# Patient Record
Sex: Female | Born: 1975 | Race: White | Hispanic: No | Marital: Married | Smoking: Never smoker
Health system: Southern US, Community
[De-identification: ages and names within clinical notes are randomized; demographics above are authoritative.]

## PROBLEM LIST (undated history)

## (undated) DIAGNOSIS — K219 Gastro-esophageal reflux disease without esophagitis: Secondary | ICD-10-CM

## (undated) HISTORY — DX: Gastro-esophageal reflux disease without esophagitis: K21.9

---

## 2002-11-06 ENCOUNTER — Other Ambulatory Visit: Admission: RE | Admit: 2002-11-06 | Discharge: 2002-11-06 | Payer: Self-pay | Admitting: *Deleted

## 2003-11-19 ENCOUNTER — Other Ambulatory Visit: Admission: RE | Admit: 2003-11-19 | Discharge: 2003-11-19 | Payer: Self-pay | Admitting: Family Medicine

## 2004-12-09 ENCOUNTER — Other Ambulatory Visit: Admission: RE | Admit: 2004-12-09 | Discharge: 2004-12-09 | Payer: Self-pay | Admitting: Family Medicine

## 2005-10-24 HISTORY — PX: LASIK: SHX215

## 2006-02-20 ENCOUNTER — Other Ambulatory Visit: Admission: RE | Admit: 2006-02-20 | Discharge: 2006-02-20 | Payer: Self-pay | Admitting: Family Medicine

## 2007-02-26 ENCOUNTER — Other Ambulatory Visit: Admission: RE | Admit: 2007-02-26 | Discharge: 2007-02-26 | Payer: Self-pay | Admitting: Family Medicine

## 2008-02-27 ENCOUNTER — Other Ambulatory Visit: Admission: RE | Admit: 2008-02-27 | Discharge: 2008-02-27 | Payer: Self-pay | Admitting: Family Medicine

## 2009-07-06 ENCOUNTER — Other Ambulatory Visit: Admission: RE | Admit: 2009-07-06 | Discharge: 2009-07-06 | Payer: Self-pay | Admitting: Family Medicine

## 2009-12-11 ENCOUNTER — Emergency Department (HOSPITAL_COMMUNITY): Admission: EM | Admit: 2009-12-11 | Discharge: 2009-12-11 | Payer: Self-pay | Admitting: Emergency Medicine

## 2010-01-12 ENCOUNTER — Ambulatory Visit: Payer: Self-pay | Admitting: Oncology

## 2010-01-20 LAB — CBC WITH DIFFERENTIAL/PLATELET
BASO%: 0.6 % (ref 0.0–2.0)
EOS%: 24.8 % — ABNORMAL HIGH (ref 0.0–7.0)
MCH: 31.5 pg (ref 25.1–34.0)
MCHC: 34.1 g/dL (ref 31.5–36.0)
MCV: 92.4 fL (ref 79.5–101.0)
MONO%: 3.2 % (ref 0.0–14.0)
RBC: 3.8 10*6/uL (ref 3.70–5.45)
RDW: 14.2 % (ref 11.2–14.5)
lymph#: 2 10*3/uL (ref 0.9–3.3)

## 2010-01-20 LAB — COMPREHENSIVE METABOLIC PANEL
ALT: 25 U/L (ref 0–35)
AST: 17 U/L (ref 0–37)
Albumin: 4 g/dL (ref 3.5–5.2)
Alkaline Phosphatase: 44 U/L (ref 39–117)
BUN: 11 mg/dL (ref 6–23)
Potassium: 4.3 mEq/L (ref 3.5–5.3)
Sodium: 140 mEq/L (ref 135–145)
Total Protein: 6.7 g/dL (ref 6.0–8.3)

## 2010-01-20 LAB — CHCC SMEAR

## 2010-03-08 ENCOUNTER — Ambulatory Visit: Payer: Self-pay | Admitting: Oncology

## 2010-03-10 LAB — CHCC SMEAR

## 2010-03-10 LAB — CBC WITH DIFFERENTIAL/PLATELET
BASO%: 0.4 % (ref 0.0–2.0)
LYMPH%: 39.9 % (ref 14.0–49.7)
MCHC: 34 g/dL (ref 31.5–36.0)
MCV: 92 fL (ref 79.5–101.0)
MONO#: 0.3 10*3/uL (ref 0.1–0.9)
MONO%: 3.9 % (ref 0.0–14.0)
NEUT#: 3.5 10*3/uL (ref 1.5–6.5)
Platelets: 294 10*3/uL (ref 145–400)
RBC: 3.66 10*6/uL — ABNORMAL LOW (ref 3.70–5.45)
RDW: 13.5 % (ref 11.2–14.5)
WBC: 6.7 10*3/uL (ref 3.9–10.3)

## 2010-07-05 ENCOUNTER — Encounter: Admission: RE | Admit: 2010-07-05 | Discharge: 2010-07-05 | Payer: Self-pay | Admitting: Internal Medicine

## 2010-07-19 ENCOUNTER — Other Ambulatory Visit: Admission: RE | Admit: 2010-07-19 | Discharge: 2010-07-19 | Payer: Self-pay | Admitting: *Deleted

## 2011-05-18 ENCOUNTER — Ambulatory Visit
Admission: RE | Admit: 2011-05-18 | Discharge: 2011-05-18 | Disposition: A | Discharge status: Home / Self Care | Payer: Commercial Managed Care - PPO | Source: Ambulatory Visit | Attending: Family Medicine | Admitting: Family Medicine

## 2011-05-18 ENCOUNTER — Other Ambulatory Visit: Payer: Self-pay | Admitting: Family Medicine

## 2011-05-18 DIAGNOSIS — R05 Cough: Secondary | ICD-10-CM

## 2011-05-18 DIAGNOSIS — R059 Cough, unspecified: Secondary | ICD-10-CM

## 2011-08-02 ENCOUNTER — Encounter: Payer: Self-pay | Admitting: Pulmonary Disease

## 2011-08-02 ENCOUNTER — Ambulatory Visit (INDEPENDENT_AMBULATORY_CARE_PROVIDER_SITE_OTHER): Payer: 59 | Admitting: Pulmonary Disease

## 2011-08-02 VITALS — BP 110/70 | HR 77 | Temp 98.0°F | Ht 67.0 in | Wt 159.8 lb

## 2011-08-02 DIAGNOSIS — R05 Cough: Secondary | ICD-10-CM

## 2011-08-02 DIAGNOSIS — R059 Cough, unspecified: Secondary | ICD-10-CM

## 2011-08-02 DIAGNOSIS — R0609 Other forms of dyspnea: Secondary | ICD-10-CM

## 2011-08-02 DIAGNOSIS — R0989 Other specified symptoms and signs involving the circulatory and respiratory systems: Secondary | ICD-10-CM

## 2011-08-02 DIAGNOSIS — R0600 Dyspnea, unspecified: Secondary | ICD-10-CM | POA: Insufficient documentation

## 2011-08-02 MED ORDER — ESOMEPRAZOLE MAGNESIUM 40 MG PO CPDR: 40.0000 mg | DELAYED_RELEASE_CAPSULE | Freq: Two times a day (BID) | ORAL | Status: DC

## 2011-08-02 NOTE — Assessment & Plan Note (Signed)
The patient has a perception of dyspnea that is manifested as an insufficient depth of inspiration and feeling of air hunger at rest and with exertion.  She denies any history that is suggestive of neuromuscular weakness.  She has a normal chest x-ray, normal air flow on exam, and normal spirometry.  I suspect this is not asthma, but if she continues to have symptoms, would recommend a methacholine challenge test to put the issue to rest.  In the interim, I would like to treat her emperically for possible reflux disease.  If she continues to have symptoms after treatment for 3 weeks, would consider proceeding with the methacholine challenge.

## 2011-08-02 NOTE — Patient Instructions (Signed)
Will try nexium 40mg  one in am and pm for next 3 weeks to see if helps. If this does not help, would consider methacholine challenge testing to put the issue of asthma to rest.

## 2011-08-02 NOTE — Progress Notes (Signed)
  Subjective:    Patient ID: Debra Downs, female    DOB: 30-Dec-1975, 35 y.o.   MRN: [id number]  HPI The patient is a 35 year old female who I've been asked to see for dyspnea.  She was in her usual state of health until April of this year, when she began to feel she could not get enough air.  She would force herself to take deep breaths frequently and also sigh breaths.  She did not feel as much restriction as the inability to take a deep breath.  She denied any wheezing.  The patient states last few weeks that it has worsened, and is now beginning to bother her exercise tolerance.  It bothers her throughout the day, and causes her to take a deep breath fairly frequently.  It does not bother her at night while sleeping.  She has noted that she can get winded with long conversations.  The patient has also developed a dry hacking cough, and states that it is worse when her breathing is worse.  She denies any significant postnasal drip, and is unsure if she has any reflux symptoms.  She denies any generalized muscle weakness.  The patient has no history of childhood asthma, and has never smoked.  She has been treated with antibiotics, as well as multiple medications for allergic rhinitis.  She has also been started on an albuterol inhaler, but is unsure if this has really made a difference.  She has never had spirometry.   Review of Systems  Constitutional: Negative for fever and unexpected weight change.  HENT: Positive for congestion. Negative for ear pain, nosebleeds, sore throat, rhinorrhea, sneezing, trouble swallowing, dental problem, postnasal drip and sinus pressure.   Eyes: Negative for redness and itching.  Respiratory: Positive for cough and shortness of breath. Negative for chest tightness and wheezing.   Cardiovascular: Negative for palpitations and leg swelling.  Gastrointestinal: Negative for nausea and vomiting.  Genitourinary: Negative for dysuria.  Musculoskeletal: Negative for  joint swelling.  Skin: Negative for rash.  Neurological: Negative for headaches.  Hematological: Does not bruise/bleed easily.  Psychiatric/Behavioral: Negative for dysphoric mood. The patient is not nervous/anxious.        Objective:   Physical Exam Constitutional:  Well developed, no acute distress  HENT:  Nares patent without discharge  Oropharynx without exudate, palate and uvula are normal  Eyes:  Perrla, eomi, no scleral icterus  Neck:  No JVD, no TMG  Cardiovascular:  Normal rate, regular rhythm, no rubs or gallops.  No murmurs        Intact distal pulses  Pulmonary :  Normal breath sounds, no stridor or respiratory distress   No rales, rhonchi, or wheezing  Abdominal:  Soft, nondistended, bowel sounds present.  No tenderness noted.   Musculoskeletal:  No lower extremity edema noted.  Lymph Nodes:  No cervical lymphadenopathy noted  Skin:  No cyanosis noted  Neurologic:  Alert, appropriate, moves all 4 extremities without obvious deficit.         Assessment & Plan:

## 2011-08-08 ENCOUNTER — Other Ambulatory Visit (HOSPITAL_COMMUNITY)
Admission: RE | Admit: 2011-08-08 | Discharge: 2011-08-08 | Disposition: A | Discharge status: Home / Self Care | Payer: 59 | Source: Ambulatory Visit | Attending: Family Medicine | Admitting: Family Medicine

## 2011-08-08 ENCOUNTER — Other Ambulatory Visit: Payer: Self-pay

## 2011-08-08 DIAGNOSIS — Z01419 Encounter for gynecological examination (general) (routine) without abnormal findings: Secondary | ICD-10-CM | POA: Insufficient documentation

## 2011-08-26 ENCOUNTER — Telehealth: Payer: Self-pay | Admitting: Pulmonary Disease

## 2011-08-26 NOTE — Telephone Encounter (Signed)
[email or web address]  

## 2011-08-29 NOTE — Telephone Encounter (Signed)
Pt returned triage's call & can be reached at [phone].  Debra Downs

## 2011-08-29 NOTE — Telephone Encounter (Signed)
Spoke with pt and she states that her cough and chest discomfort and completely resolved. She states that every once in a while she will feel like she needs to take a very deep breath and can not get a good deep breath, but she relates this to stress at work. Would like to know if needs to continue on nexium bid, if so needs rx called in. Please advise, thanks!

## 2011-08-29 NOTE — Telephone Encounter (Signed)
Would stay on nexium am and pm for 4 more weeks, then can decrease down to once a day and stay there.

## 2011-08-29 NOTE — Telephone Encounter (Signed)
Pt states nexium is working well for her and cough has improved. Please advise if the pt is to continue this at twice daily? Carron Curie, CMA

## 2011-08-30 MED ORDER — ESOMEPRAZOLE MAGNESIUM 40 MG PO CPDR: 40.0000 mg | DELAYED_RELEASE_CAPSULE | Freq: Two times a day (BID) | ORAL | Status: DC

## 2011-08-30 NOTE — Telephone Encounter (Signed)
Patient nortified of [facility] recs and a new rx was sent to the pt's pharmacy.

## 2012-02-08 ENCOUNTER — Other Ambulatory Visit: Payer: Self-pay | Admitting: Pulmonary Disease

## 2012-08-16 ENCOUNTER — Other Ambulatory Visit: Payer: Self-pay | Admitting: Family Medicine

## 2012-08-16 DIAGNOSIS — N63 Unspecified lump in unspecified breast: Secondary | ICD-10-CM

## 2012-08-27 ENCOUNTER — Ambulatory Visit
Admission: RE | Admit: 2012-08-27 | Discharge: 2012-08-27 | Disposition: A | Discharge status: Home / Self Care | Payer: 59 | Source: Ambulatory Visit | Attending: Family Medicine | Admitting: Family Medicine

## 2012-08-27 DIAGNOSIS — N63 Unspecified lump in unspecified breast: Secondary | ICD-10-CM

## 2013-04-23 ENCOUNTER — Ambulatory Visit (INDEPENDENT_AMBULATORY_CARE_PROVIDER_SITE_OTHER): Payer: 59 | Admitting: Family Medicine

## 2013-04-23 ENCOUNTER — Encounter: Payer: Self-pay | Admitting: Family Medicine

## 2013-04-23 VITALS — BP 110/69 | HR 81 | Ht 66.0 in | Wt 160.0 lb

## 2013-04-23 DIAGNOSIS — M79672 Pain in left foot: Secondary | ICD-10-CM

## 2013-04-23 DIAGNOSIS — M79609 Pain in unspecified limb: Secondary | ICD-10-CM

## 2013-04-23 NOTE — Patient Instructions (Addendum)
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 Avoid flat shoes, sandals, barefoot walking. Consider a hard soled shoe but your comfortable shoes you have on now work well too. Ice the area of pain 15 minutes at a time 3-4 times a day. Ibuprofen 600mg  three times a day with food x 7 days then as needed for pain and inflammation. In about 1 week start the towel toe exercises flexion and extension 3 sets of 10 once a day. Follow up with me in 4 weeks for reevaluation.

## 2013-04-24 ENCOUNTER — Encounter: Payer: Self-pay | Admitting: Family Medicine

## 2013-04-24 DIAGNOSIS — M79672 Pain in left foot: Secondary | ICD-10-CM | POA: Insufficient documentation

## 2013-04-24 NOTE — Progress Notes (Signed)
Patient ID: Debra Downs, female   DOB: [birth year], 37 y.o.   MRN: [id number]  PCP: Cain Saupe, MD  Subjective:   HPI: Patient is a 37 y.o. female here for left foot pain.  Patient reports for the past 4-6 weeks has started to develop medial left foot pain. No known injury or trauma. Noticed after doing exercise videos. Bothers her almost exclusively when barefoot or walking with sandals. No pain now with shoes that have a heel in them. No bruising, maybe slight swelling. Worse with stairs and walking for exercise. Has been elevating, wrapping, icing.  Past Medical History  Diagnosis Date  . Seborrheic dermatitis   . GERD (gastroesophageal reflux disease)     Current Outpatient Prescriptions on File Prior to Visit  Medication Sig Dispense Refill  . Multiple Vitamins-Minerals (MULTIVITAMIN WITH MINERALS) tablet Take 1 tablet by mouth daily.         No current facility-administered medications on file prior to visit.    Past Surgical History  Procedure Laterality Date  . Lasik  2007    No Known Allergies  History   Social History  . Marital Status: Married    Spouse Name: N/A    Number of Children: N/A  . Years of Education: N/A   Occupational History  . pharmacists [employer]   Social History Main Topics  . Smoking status: Never Smoker   . Smokeless tobacco: Not on file  . Alcohol Use: Yes     Comment: once a month  . Drug Use: No  . Sexually Active: Not on file   Other Topics Concern  . Not on file   Social History Narrative  . No narrative on file    Family History  Problem Relation Age of Onset  . Factor V Leiden deficiency      maternal aunt, 4 great uncles, and great granfather  . Colon cancer Paternal Grandmother   . Colon cancer Paternal Grandfather   . Diabetes Father   . Hyperlipidemia Father   . Heart attack Neg Hx   . Hypertension Neg Hx     BP 110/69  Pulse 81  Ht 5\' 6"  (1.676 m)  Wt 160 lb (72.576 kg)  BMI 25.84  kg/m2  Review of Systems: See HPI above.    Objective:  Physical Exam:  Gen: NAD  L foot/ankle: Cavus. No gross deformity, swelling, ecchymoses FROM ankle and digits - pain on active extension and flexion of great toe reproducing her pain. No bony tenderness of metatarsals, navicular, base 5th, elsewhere about foot/ankle. Negative ant drawer and talar tilt.   Negative syndesmotic compression. Thompsons test negative. NV intact distally.    MSK u/s:  No evidence of cortical irregularity, edema overlying cortex, no increased neovascularity of 1st metatarsal.  Assessment & Plan:  1. Left foot pain - No evidence of stress fracture (no tenderness, msk u/s normal, not running > 20 miles, pain resolves with certain shoes).  History and exam consistent with extensor hallucis tendinopathy.  Avoid flat shoes, sandals, barefoot walking.  Icing, nsaids.  In 1 week start home exercise program.  F/u in 4 weeks for reevaluation.

## 2013-04-24 NOTE — Assessment & Plan Note (Signed)
No evidence of stress fracture (no tenderness, msk u/s normal, not running > 20 miles, pain resolves with certain shoes).  History and exam consistent with extensor hallucis tendinopathy.  Avoid flat shoes, sandals, barefoot walking.  Icing, nsaids.  In 1 week start home exercise program.  F/u in 4 weeks for reevaluation.

## 2013-04-25 ENCOUNTER — Ambulatory Visit: Payer: 59 | Admitting: Family Medicine

## 2014-11-26 ENCOUNTER — Other Ambulatory Visit: Payer: Self-pay | Admitting: Family Medicine

## 2014-11-26 ENCOUNTER — Other Ambulatory Visit (HOSPITAL_COMMUNITY)
Admission: RE | Admit: 2014-11-26 | Discharge: 2014-11-26 | Disposition: A | Discharge status: Home / Self Care | Payer: 59 | Source: Ambulatory Visit | Attending: Family Medicine | Admitting: Family Medicine

## 2014-11-26 DIAGNOSIS — Z01419 Encounter for gynecological examination (general) (routine) without abnormal findings: Secondary | ICD-10-CM | POA: Insufficient documentation

## 2014-11-26 DIAGNOSIS — E041 Nontoxic single thyroid nodule: Secondary | ICD-10-CM

## 2014-11-27 ENCOUNTER — Ambulatory Visit
Admission: RE | Admit: 2014-11-27 | Discharge: 2014-11-27 | Disposition: A | Discharge status: Home / Self Care | Payer: 59 | Source: Ambulatory Visit | Attending: Family Medicine | Admitting: Family Medicine

## 2014-11-27 DIAGNOSIS — E041 Nontoxic single thyroid nodule: Secondary | ICD-10-CM

## 2014-12-01 LAB — CYTOLOGY - PAP

## 2015-11-23 MED FILL — CLOBETASOL 0.05% CREAM: 0.05 | 20 days supply | Qty: 60 | Fill #1

## 2015-12-01 DIAGNOSIS — Z0000 Encounter for general adult medical examination without abnormal findings: Secondary | ICD-10-CM | POA: Diagnosis not present

## 2015-12-01 DIAGNOSIS — Z13220 Encounter for screening for lipoid disorders: Secondary | ICD-10-CM | POA: Diagnosis not present

## 2015-12-01 MED FILL — UREA 20% CREAM: 20 | 30 days supply | Qty: 85 | Fill #0

## 2015-12-03 MED FILL — PANTOPRAZOLE SOD DR 40 MG T: 40 | 90 days supply | Qty: 90 | Fill #0

## 2015-12-23 DIAGNOSIS — L308 Other specified dermatitis: Secondary | ICD-10-CM | POA: Diagnosis not present

## 2015-12-23 DIAGNOSIS — L400 Psoriasis vulgaris: Secondary | ICD-10-CM | POA: Diagnosis not present

## 2015-12-23 MED FILL — MUPIROCIN 2% OINTMENT: 2 | 20 days supply | Qty: 22 | Fill #0

## 2015-12-23 MED FILL — CALCIPOTRIENE-BETAMETH DP O: 0.005-0.064 | 30 days supply | Qty: 60 | Fill #0

## 2016-01-13 DIAGNOSIS — H52223 Regular astigmatism, bilateral: Secondary | ICD-10-CM | POA: Diagnosis not present

## 2016-01-13 DIAGNOSIS — L400 Psoriasis vulgaris: Secondary | ICD-10-CM | POA: Diagnosis not present

## 2016-01-13 DIAGNOSIS — H5213 Myopia, bilateral: Secondary | ICD-10-CM | POA: Diagnosis not present

## 2016-01-13 MED FILL — CLOBETASOL 0.05% SOLUTION: 0.05 | 20 days supply | Qty: 50 | Fill #0

## 2016-01-13 MED FILL — CALCIPOTRIENE 0.005% CREAM: 0.005 | 20 days supply | Qty: 60 | Fill #0

## 2016-03-14 MED FILL — PANTOPRAZOLE SOD DR 40 MG T: 40 | 90 days supply | Qty: 90 | Fill #1

## 2016-06-09 MED FILL — PANTOPRAZOLE SOD DR 40 MG T: 40 | 90 days supply | Qty: 90 | Fill #2

## 2016-08-04 MED FILL — CLOBETASOL 0.05% SOLUTION: 0.05 | 20 days supply | Qty: 50 | Fill #1

## 2016-08-30 ENCOUNTER — Telehealth: Payer: 59 | Admitting: Nurse Practitioner

## 2016-08-30 DIAGNOSIS — N3000 Acute cystitis without hematuria: Secondary | ICD-10-CM

## 2016-08-30 MED ORDER — NITROFURANTOIN MONOHYD MACRO 100 MG PO CAPS: 100.0000 mg | ORAL_CAPSULE | Freq: Two times a day (BID) | ORAL | 0 refills | Status: DC

## 2016-08-30 NOTE — Progress Notes (Signed)

## 2016-08-31 MED FILL — NITROFURANTOIN MONO-MCR 100: 100 | 7 days supply | Qty: 14 | Fill #0

## 2016-09-06 MED FILL — PANTOPRAZOLE SOD DR 40 MG T: 40 | 90 days supply | Qty: 90 | Fill #3

## 2016-12-07 DIAGNOSIS — K219 Gastro-esophageal reflux disease without esophagitis: Secondary | ICD-10-CM | POA: Diagnosis not present

## 2016-12-07 DIAGNOSIS — Z0000 Encounter for general adult medical examination without abnormal findings: Secondary | ICD-10-CM | POA: Diagnosis not present

## 2016-12-07 DIAGNOSIS — Z800 Family history of malignant neoplasm of digestive organs: Secondary | ICD-10-CM | POA: Diagnosis not present

## 2016-12-07 DIAGNOSIS — Z13220 Encounter for screening for lipoid disorders: Secondary | ICD-10-CM | POA: Diagnosis not present

## 2016-12-07 MED FILL — PANTOPRAZOLE SOD DR 20 MG T: 20 | 90 days supply | Qty: 90 | Fill #0

## 2016-12-19 ENCOUNTER — Other Ambulatory Visit: Payer: Self-pay | Admitting: Family Medicine

## 2016-12-19 DIAGNOSIS — Z1231 Encounter for screening mammogram for malignant neoplasm of breast: Secondary | ICD-10-CM

## 2017-01-25 DIAGNOSIS — H52223 Regular astigmatism, bilateral: Secondary | ICD-10-CM | POA: Diagnosis not present

## 2017-01-25 DIAGNOSIS — H524 Presbyopia: Secondary | ICD-10-CM | POA: Diagnosis not present

## 2017-02-01 ENCOUNTER — Ambulatory Visit
Admission: RE | Admit: 2017-02-01 | Discharge: 2017-02-01 | Disposition: A | Discharge status: Home / Self Care | Payer: 59 | Source: Ambulatory Visit | Attending: Family Medicine | Admitting: Family Medicine

## 2017-02-01 DIAGNOSIS — Z1231 Encounter for screening mammogram for malignant neoplasm of breast: Secondary | ICD-10-CM

## 2017-03-09 MED FILL — PANTOPRAZOLE SOD DR 20 MG T: 20 | 90 days supply | Qty: 90 | Fill #1

## 2017-06-09 MED FILL — PANTOPRAZOLE SOD DR 20 MG T: 20 | 90 days supply | Qty: 90 | Fill #2

## 2017-08-09 DIAGNOSIS — D2261 Melanocytic nevi of right upper limb, including shoulder: Secondary | ICD-10-CM | POA: Diagnosis not present

## 2017-08-09 DIAGNOSIS — L814 Other melanin hyperpigmentation: Secondary | ICD-10-CM | POA: Diagnosis not present

## 2017-08-09 DIAGNOSIS — L400 Psoriasis vulgaris: Secondary | ICD-10-CM | POA: Diagnosis not present

## 2017-08-09 DIAGNOSIS — D1801 Hemangioma of skin and subcutaneous tissue: Secondary | ICD-10-CM | POA: Diagnosis not present

## 2017-08-09 DIAGNOSIS — L819 Disorder of pigmentation, unspecified: Secondary | ICD-10-CM | POA: Diagnosis not present

## 2017-08-09 DIAGNOSIS — D2262 Melanocytic nevi of left upper limb, including shoulder: Secondary | ICD-10-CM | POA: Diagnosis not present

## 2017-08-09 DIAGNOSIS — L813 Cafe au lait spots: Secondary | ICD-10-CM | POA: Diagnosis not present

## 2017-08-09 DIAGNOSIS — D225 Melanocytic nevi of trunk: Secondary | ICD-10-CM | POA: Diagnosis not present

## 2017-09-12 MED FILL — PANTOPRAZOLE SOD DR 20 MG T: 20 | 90 days supply | Qty: 90 | Fill #3

## 2017-12-11 ENCOUNTER — Other Ambulatory Visit: Payer: Self-pay | Admitting: Family Medicine

## 2017-12-11 ENCOUNTER — Other Ambulatory Visit (HOSPITAL_COMMUNITY)
Admission: RE | Admit: 2017-12-11 | Discharge: 2017-12-11 | Disposition: A | Discharge status: Home / Self Care | Payer: 59 | Source: Ambulatory Visit | Attending: Family Medicine | Admitting: Family Medicine

## 2017-12-11 DIAGNOSIS — Z0000 Encounter for general adult medical examination without abnormal findings: Secondary | ICD-10-CM | POA: Diagnosis not present

## 2017-12-11 DIAGNOSIS — Z131 Encounter for screening for diabetes mellitus: Secondary | ICD-10-CM | POA: Diagnosis not present

## 2017-12-11 DIAGNOSIS — K219 Gastro-esophageal reflux disease without esophagitis: Secondary | ICD-10-CM | POA: Diagnosis not present

## 2017-12-11 DIAGNOSIS — Z01411 Encounter for gynecological examination (general) (routine) with abnormal findings: Secondary | ICD-10-CM | POA: Insufficient documentation

## 2017-12-11 DIAGNOSIS — Z23 Encounter for immunization: Secondary | ICD-10-CM | POA: Diagnosis not present

## 2017-12-11 DIAGNOSIS — Z13220 Encounter for screening for lipoid disorders: Secondary | ICD-10-CM | POA: Diagnosis not present

## 2017-12-13 LAB — CYTOLOGY - PAP
Diagnosis: NEGATIVE
HPV (WINDOPATH): NOT DETECTED

## 2017-12-28 MED FILL — PANTOPRAZOLE SOD DR 20 MG T: 20 | 90 days supply | Qty: 90 | Fill #0

## 2018-01-01 ENCOUNTER — Other Ambulatory Visit: Payer: Self-pay | Admitting: Family Medicine

## 2018-01-01 DIAGNOSIS — Z1231 Encounter for screening mammogram for malignant neoplasm of breast: Secondary | ICD-10-CM

## 2018-02-05 ENCOUNTER — Ambulatory Visit: Payer: 59

## 2018-02-08 ENCOUNTER — Ambulatory Visit
Admission: RE | Admit: 2018-02-08 | Discharge: 2018-02-08 | Disposition: A | Discharge status: Home / Self Care | Payer: 59 | Source: Ambulatory Visit | Attending: Family Medicine | Admitting: Family Medicine

## 2018-02-08 DIAGNOSIS — Z1231 Encounter for screening mammogram for malignant neoplasm of breast: Secondary | ICD-10-CM

## 2018-03-08 DIAGNOSIS — H5212 Myopia, left eye: Secondary | ICD-10-CM | POA: Diagnosis not present

## 2018-03-08 DIAGNOSIS — H52221 Regular astigmatism, right eye: Secondary | ICD-10-CM | POA: Diagnosis not present

## 2018-03-08 DIAGNOSIS — H5201 Hypermetropia, right eye: Secondary | ICD-10-CM | POA: Diagnosis not present

## 2018-04-19 ENCOUNTER — Telehealth: Payer: 59 | Admitting: Family

## 2018-04-19 DIAGNOSIS — N390 Urinary tract infection, site not specified: Secondary | ICD-10-CM | POA: Diagnosis not present

## 2018-04-19 MED ORDER — CEPHALEXIN 500 MG PO CAPS
500.0000 mg | ORAL_CAPSULE | Freq: Two times a day (BID) | ORAL | 0 refills | Status: DC
Start: 2018-04-19 — End: 2022-11-10

## 2018-04-19 MED FILL — CEPHALEXIN 500 MG CAPSULE: 500 | 7 days supply | Qty: 14 | Fill #0

## 2018-04-19 MED FILL — PANTOPRAZOLE SOD DR 20 MG T: 20 | 90 days supply | Qty: 90 | Fill #1

## 2018-04-19 NOTE — Progress Notes (Signed)

## 2018-12-11 DIAGNOSIS — Z13220 Encounter for screening for lipoid disorders: Secondary | ICD-10-CM | POA: Diagnosis not present

## 2018-12-11 DIAGNOSIS — Z0000 Encounter for general adult medical examination without abnormal findings: Secondary | ICD-10-CM | POA: Diagnosis not present

## 2019-01-02 ENCOUNTER — Other Ambulatory Visit: Payer: Self-pay | Admitting: Family Medicine

## 2019-01-02 DIAGNOSIS — Z1231 Encounter for screening mammogram for malignant neoplasm of breast: Secondary | ICD-10-CM

## 2019-03-13 DIAGNOSIS — H5212 Myopia, left eye: Secondary | ICD-10-CM | POA: Diagnosis not present

## 2019-03-13 DIAGNOSIS — H52223 Regular astigmatism, bilateral: Secondary | ICD-10-CM | POA: Diagnosis not present

## 2019-04-04 ENCOUNTER — Ambulatory Visit: Payer: 59

## 2019-05-20 ENCOUNTER — Ambulatory Visit
Admission: RE | Admit: 2019-05-20 | Discharge: 2019-05-20 | Disposition: A | Discharge status: Home / Self Care | Payer: 59 | Source: Ambulatory Visit | Attending: Family Medicine | Admitting: Family Medicine

## 2019-05-20 ENCOUNTER — Other Ambulatory Visit: Payer: Self-pay

## 2019-05-20 DIAGNOSIS — Z1231 Encounter for screening mammogram for malignant neoplasm of breast: Secondary | ICD-10-CM | POA: Diagnosis not present

## 2019-08-01 DIAGNOSIS — L814 Other melanin hyperpigmentation: Secondary | ICD-10-CM | POA: Diagnosis not present

## 2019-08-01 DIAGNOSIS — D1801 Hemangioma of skin and subcutaneous tissue: Secondary | ICD-10-CM | POA: Diagnosis not present

## 2019-08-01 DIAGNOSIS — L821 Other seborrheic keratosis: Secondary | ICD-10-CM | POA: Diagnosis not present

## 2019-08-01 DIAGNOSIS — D225 Melanocytic nevi of trunk: Secondary | ICD-10-CM | POA: Diagnosis not present

## 2019-08-01 DIAGNOSIS — L819 Disorder of pigmentation, unspecified: Secondary | ICD-10-CM | POA: Diagnosis not present

## 2019-12-24 DIAGNOSIS — Z0000 Encounter for general adult medical examination without abnormal findings: Secondary | ICD-10-CM | POA: Diagnosis not present

## 2019-12-24 DIAGNOSIS — E559 Vitamin D deficiency, unspecified: Secondary | ICD-10-CM | POA: Diagnosis not present

## 2019-12-24 DIAGNOSIS — Z13220 Encounter for screening for lipoid disorders: Secondary | ICD-10-CM | POA: Diagnosis not present

## 2019-12-24 DIAGNOSIS — D649 Anemia, unspecified: Secondary | ICD-10-CM | POA: Diagnosis not present

## 2020-03-18 DIAGNOSIS — H5201 Hypermetropia, right eye: Secondary | ICD-10-CM | POA: Diagnosis not present

## 2020-03-18 DIAGNOSIS — H52223 Regular astigmatism, bilateral: Secondary | ICD-10-CM | POA: Diagnosis not present

## 2020-03-18 DIAGNOSIS — H524 Presbyopia: Secondary | ICD-10-CM | POA: Diagnosis not present

## 2020-04-24 ENCOUNTER — Other Ambulatory Visit: Payer: Self-pay | Admitting: Family Medicine

## 2020-04-24 DIAGNOSIS — Z1231 Encounter for screening mammogram for malignant neoplasm of breast: Secondary | ICD-10-CM

## 2020-06-01 ENCOUNTER — Ambulatory Visit
Admission: RE | Admit: 2020-06-01 | Discharge: 2020-06-01 | Disposition: A | Discharge status: Home / Self Care | Payer: 59 | Source: Ambulatory Visit

## 2020-06-01 ENCOUNTER — Other Ambulatory Visit: Payer: Self-pay

## 2020-06-01 DIAGNOSIS — Z1231 Encounter for screening mammogram for malignant neoplasm of breast: Secondary | ICD-10-CM | POA: Diagnosis not present

## 2020-11-13 DIAGNOSIS — M79652 Pain in left thigh: Secondary | ICD-10-CM | POA: Diagnosis not present

## 2020-12-25 DIAGNOSIS — E7800 Pure hypercholesterolemia, unspecified: Secondary | ICD-10-CM | POA: Diagnosis not present

## 2020-12-25 DIAGNOSIS — D649 Anemia, unspecified: Secondary | ICD-10-CM | POA: Diagnosis not present

## 2020-12-25 DIAGNOSIS — E559 Vitamin D deficiency, unspecified: Secondary | ICD-10-CM | POA: Diagnosis not present

## 2020-12-25 DIAGNOSIS — Z0000 Encounter for general adult medical examination without abnormal findings: Secondary | ICD-10-CM | POA: Diagnosis not present

## 2020-12-25 DIAGNOSIS — E611 Iron deficiency: Secondary | ICD-10-CM | POA: Diagnosis not present

## 2021-04-12 DIAGNOSIS — H52223 Regular astigmatism, bilateral: Secondary | ICD-10-CM | POA: Diagnosis not present

## 2021-04-12 DIAGNOSIS — H524 Presbyopia: Secondary | ICD-10-CM | POA: Diagnosis not present

## 2021-04-12 DIAGNOSIS — H5201 Hypermetropia, right eye: Secondary | ICD-10-CM | POA: Diagnosis not present

## 2021-04-12 DIAGNOSIS — H5212 Myopia, left eye: Secondary | ICD-10-CM | POA: Diagnosis not present

## 2021-05-12 ENCOUNTER — Other Ambulatory Visit: Payer: Self-pay | Admitting: Family Medicine

## 2021-05-12 DIAGNOSIS — Z1231 Encounter for screening mammogram for malignant neoplasm of breast: Secondary | ICD-10-CM

## 2021-07-05 ENCOUNTER — Other Ambulatory Visit: Payer: Self-pay

## 2021-07-05 ENCOUNTER — Ambulatory Visit
Admission: RE | Admit: 2021-07-05 | Discharge: 2021-07-05 | Disposition: A | Discharge status: Home / Self Care | Payer: 59 | Source: Ambulatory Visit | Attending: Family Medicine | Admitting: Family Medicine

## 2021-07-05 ENCOUNTER — Other Ambulatory Visit: Payer: Self-pay | Admitting: Family Medicine

## 2021-07-05 DIAGNOSIS — N63 Unspecified lump in unspecified breast: Secondary | ICD-10-CM

## 2021-07-05 DIAGNOSIS — Z1231 Encounter for screening mammogram for malignant neoplasm of breast: Secondary | ICD-10-CM

## 2021-08-24 ENCOUNTER — Other Ambulatory Visit: Payer: Self-pay

## 2021-08-24 ENCOUNTER — Ambulatory Visit
Admission: RE | Admit: 2021-08-24 | Discharge: 2021-08-24 | Disposition: A | Discharge status: Home / Self Care | Payer: 59 | Source: Ambulatory Visit | Attending: Family Medicine | Admitting: Family Medicine

## 2021-08-24 DIAGNOSIS — N6489 Other specified disorders of breast: Secondary | ICD-10-CM | POA: Diagnosis not present

## 2021-08-24 DIAGNOSIS — N63 Unspecified lump in unspecified breast: Secondary | ICD-10-CM

## 2021-08-24 DIAGNOSIS — R922 Inconclusive mammogram: Secondary | ICD-10-CM | POA: Diagnosis not present

## 2021-12-01 DIAGNOSIS — L812 Freckles: Secondary | ICD-10-CM | POA: Diagnosis not present

## 2021-12-01 DIAGNOSIS — D2261 Melanocytic nevi of right upper limb, including shoulder: Secondary | ICD-10-CM | POA: Diagnosis not present

## 2021-12-01 DIAGNOSIS — D225 Melanocytic nevi of trunk: Secondary | ICD-10-CM | POA: Diagnosis not present

## 2021-12-01 DIAGNOSIS — L814 Other melanin hyperpigmentation: Secondary | ICD-10-CM | POA: Diagnosis not present

## 2021-12-01 DIAGNOSIS — L821 Other seborrheic keratosis: Secondary | ICD-10-CM | POA: Diagnosis not present

## 2021-12-01 DIAGNOSIS — L819 Disorder of pigmentation, unspecified: Secondary | ICD-10-CM | POA: Diagnosis not present

## 2022-01-04 DIAGNOSIS — Z13220 Encounter for screening for lipoid disorders: Secondary | ICD-10-CM | POA: Diagnosis not present

## 2022-01-04 DIAGNOSIS — Z131 Encounter for screening for diabetes mellitus: Secondary | ICD-10-CM | POA: Diagnosis not present

## 2022-01-04 DIAGNOSIS — Z0000 Encounter for general adult medical examination without abnormal findings: Secondary | ICD-10-CM | POA: Diagnosis not present

## 2022-04-14 DIAGNOSIS — H52223 Regular astigmatism, bilateral: Secondary | ICD-10-CM | POA: Diagnosis not present

## 2022-04-14 DIAGNOSIS — H524 Presbyopia: Secondary | ICD-10-CM | POA: Diagnosis not present

## 2022-05-13 DIAGNOSIS — M7989 Other specified soft tissue disorders: Secondary | ICD-10-CM | POA: Diagnosis not present

## 2022-05-16 ENCOUNTER — Other Ambulatory Visit (HOSPITAL_BASED_OUTPATIENT_CLINIC_OR_DEPARTMENT_OTHER): Payer: Self-pay | Admitting: Family Medicine

## 2022-05-16 DIAGNOSIS — M7989 Other specified soft tissue disorders: Secondary | ICD-10-CM

## 2022-05-17 ENCOUNTER — Ambulatory Visit (HOSPITAL_BASED_OUTPATIENT_CLINIC_OR_DEPARTMENT_OTHER)
Admission: RE | Admit: 2022-05-17 | Discharge: 2022-05-17 | Disposition: A | Discharge status: Home / Self Care | Payer: 59 | Source: Ambulatory Visit | Attending: Family Medicine | Admitting: Family Medicine

## 2022-05-17 DIAGNOSIS — M7989 Other specified soft tissue disorders: Secondary | ICD-10-CM | POA: Insufficient documentation

## 2022-07-22 ENCOUNTER — Other Ambulatory Visit (HOSPITAL_COMMUNITY): Payer: Self-pay

## 2022-07-22 MED ORDER — PEG 3350-KCL-NA BICARB-NACL 420 G PO SOLR
ORAL | 0 refills | Status: DC
  Filled 2022-07-22: qty 4000, 1d supply, fill #0

## 2022-07-29 DIAGNOSIS — D120 Benign neoplasm of cecum: Secondary | ICD-10-CM | POA: Diagnosis not present

## 2022-07-29 DIAGNOSIS — Z1211 Encounter for screening for malignant neoplasm of colon: Secondary | ICD-10-CM | POA: Diagnosis not present

## 2022-08-09 ENCOUNTER — Other Ambulatory Visit: Payer: Self-pay | Admitting: Family Medicine

## 2022-08-09 DIAGNOSIS — Z1231 Encounter for screening mammogram for malignant neoplasm of breast: Secondary | ICD-10-CM

## 2022-09-01 ENCOUNTER — Ambulatory Visit (INDEPENDENT_AMBULATORY_CARE_PROVIDER_SITE_OTHER): Payer: Self-pay | Admitting: Podiatry

## 2022-09-01 ENCOUNTER — Ambulatory Visit (INDEPENDENT_AMBULATORY_CARE_PROVIDER_SITE_OTHER): Payer: 59

## 2022-09-01 DIAGNOSIS — M19072 Primary osteoarthritis, left ankle and foot: Secondary | ICD-10-CM

## 2022-09-01 DIAGNOSIS — M79672 Pain in left foot: Secondary | ICD-10-CM

## 2022-09-01 DIAGNOSIS — Q667 Congenital pes cavus, unspecified foot: Secondary | ICD-10-CM

## 2022-09-01 NOTE — Progress Notes (Signed)
Subjective:  Patient ID: Debra Downs, female    DOB: 1976/06/15,  MRN: [id number]  Chief Complaint  Patient presents with   Foot Pain    Top of left foot pain 2 months     46 y.o. female presents with the above complaint.  Patient presents with left dorsal midfoot pain with underlying pes cavus foot type.  Patient is causing a lot of pain especially when on her foot.  She went to get it evaluated she has not seen anyone as prior to seeing me.  Denies any other acute complaints hurts with ambulation hurts with pressure   Review of Systems: Negative except as noted in the HPI. Denies N/V/F/Ch.  Past Medical History:  Diagnosis Date   GERD (gastroesophageal reflux disease)    Seborrheic dermatitis     Current Outpatient Medications:    calcium carbonate (OS-CAL) 600 MG TABS, Take 600 mg by mouth 2 (two) times daily with a meal., Disp: , Rfl:    cephALEXin (KEFLEX) 500 MG capsule, Take 1 capsule (500 mg total) by mouth 2 (two) times daily., Disp: 14 capsule, Rfl: 0   Multiple Vitamins-Minerals (MULTIVITAMIN WITH MINERALS) tablet, Take 1 tablet by mouth daily.  , Disp: , Rfl:    nitrofurantoin, macrocrystal-monohydrate, (MACROBID) 100 MG capsule, Take 1 capsule (100 mg total) by mouth 2 (two) times daily. 1 po BId, Disp: 14 capsule, Rfl: 0   pantoprazole (PROTONIX) 20 MG tablet, Take 20 mg by mouth daily., Disp: , Rfl:    polyethylene glycol-electrolytes (NULYTELY) 420 g solution, Use as directed., Disp: 4000 mL, Rfl: 0  Social History   Tobacco Use  Smoking Status Never  Smokeless Tobacco Not on file    No Known Allergies Objective:  There were no vitals filed for this visit. There is no height or weight on file to calculate BMI. Constitutional Well developed. Well nourished.  Vascular Dorsalis pedis pulses palpable bilaterally. Posterior tibial pulses palpable bilaterally. Capillary refill normal to all digits.  No cyanosis or clubbing noted. Pedal hair growth  normal.  Neurologic Normal speech. Oriented to person, place, and time. Epicritic sensation to light touch grossly present bilaterally.  Dermatologic Nails well groomed and normal in appearance. No open wounds. No skin lesions.  Orthopedic: Pain on palpation to the dorsal midfoot.  Negative extensor tendinitis negative Lisfranc interval pain noted.  No pain at the metatarsophalangeal joint.  Underlying clinical superficial arthritis clinically appreciated.   Radiographs: 3 views of skeletally mature adult left foot: Mild midfoot arthritis noted.  Pes cavus foot type noted.  No bony abnormalities identified. Assessment:   1. Arthritis of left midfoot   2. Pes cavus    Plan:  Patient was evaluated and treated and all questions answered.  Left dorsal midfoot pain with underlying arthritis -All questions and concerns were discussed with the patient in extensive detail I discussed shoe gear modification offloading padding protecting.  Given the amount of pain she is having she will benefit from a steroid injection to help decrease acute inflammatory component associate with pain.  Patient agrees with plan like to proceed with steroid injection -A steroid injection was performed at left dorsal midfoot using 1% plain Lidocaine and 10 mg of Kenalog. This was well tolerated.  Pes cavus -I explained to patient the etiology of pes planovalgus and relationship with arch support and various treatment options were discussed.  Given patient foot structure in the setting of arch pain support I believe patient will benefit from custom-made orthotics to  help control the hindfoot motion support the arch of the foot and take the stress away from plantar fascial.  Patient agrees with the plan like to proceed with orthotics -Patient was casted for orthotics    No follow-ups on file.

## 2022-09-23 ENCOUNTER — Ambulatory Visit
Admission: RE | Admit: 2022-09-23 | Discharge: 2022-09-23 | Disposition: A | Discharge status: Home / Self Care | Payer: 59 | Source: Ambulatory Visit | Attending: Family Medicine | Admitting: Family Medicine

## 2022-09-23 DIAGNOSIS — Z1231 Encounter for screening mammogram for malignant neoplasm of breast: Secondary | ICD-10-CM

## 2022-09-26 ENCOUNTER — Telehealth: Payer: Self-pay | Admitting: Podiatry

## 2022-09-26 NOTE — Telephone Encounter (Signed)
Lmom to call back to schedule appt to pick up orthotics - Balance is $490

## 2022-09-28 ENCOUNTER — Ambulatory Visit (INDEPENDENT_AMBULATORY_CARE_PROVIDER_SITE_OTHER): Payer: 59 | Admitting: Podiatry

## 2022-09-28 DIAGNOSIS — Q667 Congenital pes cavus, unspecified foot: Secondary | ICD-10-CM

## 2022-09-28 DIAGNOSIS — M19072 Primary osteoarthritis, left ankle and foot: Secondary | ICD-10-CM

## 2022-09-28 NOTE — Progress Notes (Signed)
Patient presents today to pick up custom molded foot orthotics recommended by Dr. Posey Pronto.   Orthotics were dispensed and fit was satisfactory. Reviewed instructions for break-in and wear. Written instructions given to patient.  Patient will follow up as needed.   [facility] - order # [id number]

## 2022-10-14 ENCOUNTER — Ambulatory Visit: Payer: 59 | Admitting: Podiatry

## 2022-11-10 ENCOUNTER — Ambulatory Visit (INDEPENDENT_AMBULATORY_CARE_PROVIDER_SITE_OTHER): Payer: 59 | Admitting: Podiatry

## 2022-11-10 VITALS — BP 120/64

## 2022-11-10 DIAGNOSIS — M19072 Primary osteoarthritis, left ankle and foot: Secondary | ICD-10-CM | POA: Diagnosis not present

## 2022-11-10 DIAGNOSIS — Q667 Congenital pes cavus, unspecified foot: Secondary | ICD-10-CM

## 2022-11-10 NOTE — Progress Notes (Signed)
  Subjective:  Patient ID: Debra Downs, female    DOB: 24-May-1976,  MRN: [id number]  Chief Complaint  Patient presents with   Arthritis    Pt stated that things are about the same     47 y.o. female presents with the above complaint.  Patient presents involvement of dorsal midfoot pain.  Patient states the orthotics has been helping she states that she still has some pain to the left side but overall has improved some.  She is managing the pain denies any other acute complaints   Review of Systems: Negative except as noted in the HPI. Denies N/V/F/Ch.  Past Medical History:  Diagnosis Date   GERD (gastroesophageal reflux disease)    Seborrheic dermatitis     Current Outpatient Medications:    SPIKEVAX syringe, , Disp: , Rfl:    Cholecalciferol (VITAMIN D) 125 MCG (5000 UT) CAPS, , Disp: , Rfl:    famotidine (PEPCID) 20 MG tablet, , Disp: , Rfl:    fluticasone (FLONASE ALLERGY RELIEF) 50 MCG/ACT nasal spray, , Disp: , Rfl:    ibuprofen (ADVIL) 200 MG tablet, , Disp: , Rfl:    Loratadine 10 MG CAPS, , Disp: , Rfl:    Propylene Glycol (SYSTANE BALANCE) 0.6 % SOLN, , Disp: , Rfl:   Social History   Tobacco Use  Smoking Status Never  Smokeless Tobacco Not on file    No Known Allergies Objective:   Vitals:   11/10/22 0820  BP: 120/64   There is no height or weight on file to calculate BMI. Constitutional Well developed. Well nourished.  Vascular Dorsalis pedis pulses palpable bilaterally. Posterior tibial pulses palpable bilaterally. Capillary refill normal to all digits.  No cyanosis or clubbing noted. Pedal hair growth normal.  Neurologic Normal speech. Oriented to person, place, and time. Epicritic sensation to light touch grossly present bilaterally.  Dermatologic Nails well groomed and normal in appearance. No open wounds. No skin lesions.  Orthopedic: No pain on palpation to the dorsal midfoot.  Negative extensor tendinitis negative no further Lisfranc  interval pain noted.  No pain at the metatarsophalangeal joint.  Underlying clinical superficial arthritis clinically appreciated.   Radiographs: 3 views of skeletally mature adult left foot: Mild midfoot arthritis noted.  Pes cavus foot type noted.  No bony abnormalities identified. Assessment:   No diagnosis found.  Plan:  Patient was evaluated and treated and all questions answered.  Left dorsal midfoot pain with underlying arthritis -Clinically pain improved with steroid treatment.  For now she will continue to manage it.  She still has some residual pain on the midfoot.  I discussed shoe gear modification prevention technique if any foot and ankle issues on future she will come back and see me.  Pes cavus -I explained to patient the etiology of pes planovalgus and relationship with arch support and various treatment options were discussed.  Given patient foot structure in the setting of arch pain support I believe patient will benefit from custom-made orthotics to help control the hindfoot motion support the arch of the foot and take the stress away from plantar fascial.  Patient agrees with the plan like to proceed with orthotics - Functioning well.    No follow-ups on file.

## 2022-12-09 DIAGNOSIS — I8391 Asymptomatic varicose veins of right lower extremity: Secondary | ICD-10-CM | POA: Diagnosis not present

## 2022-12-09 DIAGNOSIS — L814 Other melanin hyperpigmentation: Secondary | ICD-10-CM | POA: Diagnosis not present

## 2022-12-09 DIAGNOSIS — D225 Melanocytic nevi of trunk: Secondary | ICD-10-CM | POA: Diagnosis not present

## 2022-12-09 DIAGNOSIS — D2271 Melanocytic nevi of right lower limb, including hip: Secondary | ICD-10-CM | POA: Diagnosis not present

## 2022-12-09 DIAGNOSIS — L819 Disorder of pigmentation, unspecified: Secondary | ICD-10-CM | POA: Diagnosis not present

## 2022-12-09 DIAGNOSIS — L821 Other seborrheic keratosis: Secondary | ICD-10-CM | POA: Diagnosis not present

## 2022-12-20 ENCOUNTER — Encounter: Payer: Self-pay | Admitting: Podiatry

## 2022-12-20 ENCOUNTER — Ambulatory Visit (INDEPENDENT_AMBULATORY_CARE_PROVIDER_SITE_OTHER): Payer: 59 | Admitting: Podiatry

## 2022-12-20 DIAGNOSIS — M76822 Posterior tibial tendinitis, left leg: Secondary | ICD-10-CM

## 2022-12-20 NOTE — Progress Notes (Signed)
  Subjective:  Patient ID: Debra Downs, female    DOB: [birth year],  MRN: [id number]  Chief Complaint  Patient presents with   Arthritis    Pt stated that her foot is still bothering her and still has a lot of swelling in her left foot     47 y.o. female presents with the above complaint. On ultrasound patient presents with new complaint left posterior tibial tendinitis.  Patient states that it is causing some swelling and some pain while ambulating.  She wanted to get it evaluated she has not seen anyone else prior to seeing me.  She would like to discuss treatment options for this.  Hurts with ambulation worse with pressure pain scale is 5 out of 10 dull achy in nature   Review of Systems: Negative except as noted in the HPI. Denies N/V/F/Ch.  Past Medical History:  Diagnosis Date   GERD (gastroesophageal reflux disease)    Seborrheic dermatitis     Current Outpatient Medications:    Cholecalciferol (VITAMIN D) 125 MCG (5000 UT) CAPS, , Disp: , Rfl:    famotidine (PEPCID) 20 MG tablet, , Disp: , Rfl:    fluticasone (FLONASE ALLERGY RELIEF) 50 MCG/ACT nasal spray, , Disp: , Rfl:    ibuprofen (ADVIL) 200 MG tablet, , Disp: , Rfl:    Loratadine 10 MG CAPS, , Disp: , Rfl:    Propylene Glycol (SYSTANE BALANCE) 0.6 % SOLN, , Disp: , Rfl:    SPIKEVAX syringe, , Disp: , Rfl:   Social History   Tobacco Use  Smoking Status Never  Smokeless Tobacco Not on file    No Known Allergies Objective:  There were no vitals filed for this visit. There is no height or weight on file to calculate BMI. Constitutional Well developed. Well nourished.  Vascular Dorsalis pedis pulses palpable bilaterally. Posterior tibial pulses palpable bilaterally. Capillary refill normal to all digits.  No cyanosis or clubbing noted. Pedal hair growth normal.  Neurologic Normal speech. Oriented to person, place, and time. Epicritic sensation to light touch grossly present bilaterally.  Dermatologic  Nails well groomed and normal in appearance. No open wounds. No skin lesions.  Orthopedic: Pain on palpation to the left posterior tibial tendon pain along the course of the tendon.  Weakness noted to posterior tibial tendon 4 out of 5.  No pain at the Achilles tendon peroneal tendon ATFL ligament   Radiographs: None Assessment:   1. Posterior tibial tendinitis, left    Plan:  Patient was evaluated and treated and all questions answered.  Left posterior tibial tendinitis -All questions and concerns were discussed with the patient in extensive detail -Given the amount of pain that she is experiencing she will benefit from cam boot immobilization I discussed this with patient she states understanding like to proceed with cam boot immobilization -Cam boot was dispensed  No follow-ups on file.

## 2023-01-12 ENCOUNTER — Other Ambulatory Visit (HOSPITAL_COMMUNITY): Payer: Self-pay

## 2023-01-12 DIAGNOSIS — G7249 Other inflammatory and immune myopathies, not elsewhere classified: Secondary | ICD-10-CM | POA: Diagnosis not present

## 2023-01-12 DIAGNOSIS — M79672 Pain in left foot: Secondary | ICD-10-CM | POA: Diagnosis not present

## 2023-01-12 DIAGNOSIS — M25572 Pain in left ankle and joints of left foot: Secondary | ICD-10-CM | POA: Diagnosis not present

## 2023-01-12 MED ORDER — PREDNISONE 10 MG PO TABS
ORAL_TABLET | ORAL | 0 refills | Status: AC
  Filled 2023-01-12: qty 14, 6d supply, fill #0

## 2023-01-16 DIAGNOSIS — G7249 Other inflammatory and immune myopathies, not elsewhere classified: Secondary | ICD-10-CM | POA: Diagnosis not present

## 2023-01-17 ENCOUNTER — Other Ambulatory Visit (HOSPITAL_COMMUNITY)
Admission: RE | Admit: 2023-01-17 | Discharge: 2023-01-17 | Disposition: A | Discharge status: Home / Self Care | Payer: 59 | Source: Ambulatory Visit | Attending: Family Medicine | Admitting: Family Medicine

## 2023-01-17 ENCOUNTER — Other Ambulatory Visit: Payer: Self-pay | Admitting: Family Medicine

## 2023-01-17 DIAGNOSIS — M79672 Pain in left foot: Secondary | ICD-10-CM | POA: Diagnosis not present

## 2023-01-17 DIAGNOSIS — Z0000 Encounter for general adult medical examination without abnormal findings: Secondary | ICD-10-CM | POA: Diagnosis not present

## 2023-01-17 DIAGNOSIS — Z13220 Encounter for screening for lipoid disorders: Secondary | ICD-10-CM | POA: Diagnosis not present

## 2023-01-17 DIAGNOSIS — Z131 Encounter for screening for diabetes mellitus: Secondary | ICD-10-CM | POA: Diagnosis not present

## 2023-01-17 DIAGNOSIS — Z1159 Encounter for screening for other viral diseases: Secondary | ICD-10-CM | POA: Diagnosis not present

## 2023-01-17 DIAGNOSIS — Z01411 Encounter for gynecological examination (general) (routine) with abnormal findings: Secondary | ICD-10-CM | POA: Insufficient documentation

## 2023-01-19 LAB — CYTOLOGY - PAP
Adequacy: ABSENT
Comment: NEGATIVE
Diagnosis: NEGATIVE
High risk HPV: NEGATIVE

## 2023-01-21 DIAGNOSIS — M25572 Pain in left ankle and joints of left foot: Secondary | ICD-10-CM | POA: Diagnosis not present

## 2023-01-30 DIAGNOSIS — M216X2 Other acquired deformities of left foot: Secondary | ICD-10-CM | POA: Diagnosis not present

## 2023-01-30 DIAGNOSIS — M84375A Stress fracture, left foot, initial encounter for fracture: Secondary | ICD-10-CM | POA: Diagnosis not present

## 2023-01-31 DIAGNOSIS — E7800 Pure hypercholesterolemia, unspecified: Secondary | ICD-10-CM | POA: Diagnosis not present

## 2023-02-01 DIAGNOSIS — M84375A Stress fracture, left foot, initial encounter for fracture: Secondary | ICD-10-CM | POA: Diagnosis not present

## 2023-02-10 ENCOUNTER — Ambulatory Visit: Payer: 59 | Admitting: Podiatry

## 2023-03-15 DIAGNOSIS — M84375A Stress fracture, left foot, initial encounter for fracture: Secondary | ICD-10-CM | POA: Diagnosis not present

## 2023-04-21 ENCOUNTER — Other Ambulatory Visit (HOSPITAL_COMMUNITY): Payer: Self-pay

## 2023-04-21 DIAGNOSIS — M216X2 Other acquired deformities of left foot: Secondary | ICD-10-CM | POA: Diagnosis not present

## 2023-04-21 DIAGNOSIS — M84375A Stress fracture, left foot, initial encounter for fracture: Secondary | ICD-10-CM | POA: Diagnosis not present

## 2023-04-21 MED ORDER — CALCITONIN (SALMON) 200 UNIT/ACT NA SOLN
NASAL | 2 refills | Status: AC
  Filled 2023-04-21: qty 3.7, 30d supply, fill #0
  Filled 2023-05-19: qty 3.7, 30d supply, fill #1
  Filled 2023-06-18: qty 3.7, 30d supply, fill #2

## 2023-04-24 ENCOUNTER — Other Ambulatory Visit (HOSPITAL_COMMUNITY): Payer: Self-pay

## 2023-05-16 ENCOUNTER — Other Ambulatory Visit: Payer: Self-pay | Admitting: Oncology

## 2023-05-16 DIAGNOSIS — Z006 Encounter for examination for normal comparison and control in clinical research program: Secondary | ICD-10-CM

## 2023-05-16 DIAGNOSIS — H52223 Regular astigmatism, bilateral: Secondary | ICD-10-CM | POA: Diagnosis not present

## 2023-05-24 DIAGNOSIS — M84375A Stress fracture, left foot, initial encounter for fracture: Secondary | ICD-10-CM | POA: Diagnosis not present

## 2023-06-29 ENCOUNTER — Other Ambulatory Visit (HOSPITAL_COMMUNITY): Payer: Self-pay

## 2023-07-05 ENCOUNTER — Other Ambulatory Visit (HOSPITAL_COMMUNITY): Payer: Self-pay

## 2023-07-05 ENCOUNTER — Other Ambulatory Visit (HOSPITAL_BASED_OUTPATIENT_CLINIC_OR_DEPARTMENT_OTHER): Payer: Self-pay

## 2023-07-05 DIAGNOSIS — R252 Cramp and spasm: Secondary | ICD-10-CM | POA: Diagnosis not present

## 2023-07-05 DIAGNOSIS — M84375A Stress fracture, left foot, initial encounter for fracture: Secondary | ICD-10-CM | POA: Diagnosis not present

## 2023-07-05 MED ORDER — DIAZEPAM 2 MG PO TABS
2.0000 mg | ORAL_TABLET | Freq: Every evening | ORAL | 0 refills | Status: AC
  Filled 2023-07-05: qty 5, 5d supply, fill #0

## 2023-07-07 ENCOUNTER — Other Ambulatory Visit (HOSPITAL_COMMUNITY): Payer: Self-pay

## 2023-07-19 NOTE — Therapy (Signed)
OUTPATIENT PHYSICAL THERAPY LOWER EXTREMITY EVALUATION   Patient Name: Debra Downs MRN: [id number] DOB:02-Apr-1976, 47 y.o., female Today's Date: 07/20/2023  END OF SESSION:  PT End of Session - 07/20/23 0836     Visit Number 1    Date for PT Re-Evaluation 09/14/23    Progress Note Due on Visit 10    PT Start Time 0840    PT Stop Time 0930    PT Time Calculation (min) 50 min    Activity Tolerance Patient tolerated treatment well    Behavior During Therapy WFL for tasks assessed/performed             Past Medical History:  Diagnosis Date   GERD (gastroesophageal reflux disease)    Seborrheic dermatitis    Past Surgical History:  Procedure Laterality Date   LASIK  2007   Patient Active Problem List   Diagnosis Date Noted   Left foot pain 04/24/2013    PCP: Darrow Bussing, MD  REFERRING PROVIDER: Toni Arthurs, MD  REFERRING DIAG: L midfoot stress fracture  THERAPY DIAG:  Difficulty in walking, not elsewhere classified - Plan: PT plan of care cert/re-cert  Fracture, foot, left, closed, initial encounter  Stiffness of left ankle, not elsewhere classified  Rationale for Evaluation and Treatment: Rehabilitation  ONSET DATE: one year, 2023  SUBJECTIVE:   SUBJECTIVE STATEMENT: Pain L ant ankle foot, L tibialis anterior cramps after prolonged walking, also painful walking, weight bearing on uneven surfaces  PERTINENT HISTORY: H/o L foot swelling , pain for a year.  Saw DPM, treated for tendonitis.  Saw orthopedist had MRI, in hard cast for 6 weeks with scooter, then walking boot for 6 weeks.  In compression sock and regular tennis shoes now for 2 months. Orthopedic MD orders state L ankle ROM, strengthening, gait training, dry needling L tibialis anterior PAIN:  Are you having pain? Yes: NPRS scale: 0 to 7/10 Pain location: L navicular cuboids Pain description: hurts with specific movements Aggravating factors: uneven terrain, turning foot side to  side Relieving factors: compression garment , elevation  PRECAUTIONS: None  RED FLAGS: None   WEIGHT BEARING RESTRICTIONS: No  FALLS:  Has patient fallen in last 6 months? 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  PALPATION: Tender over navicular and medial cuneiform  LOWER EXTREMITY ROM:     R   A         L    A/P Ankle dorsiflexion 14 -10/0  Ankle plantarflexion 78 60/60  Ankle inversion 70 5/5  Ankle eversion 35 5/5  L gr toe extension 30 degrees LOWER EXTREMITY MMT:  ALL WFL unless otherwise noted:   MMT Right eval Left eval  Hip flexion    Hip extension    Hip abduction    Hip adduction  Hip internal rotation    Hip external rotation    Knee flexion    Knee extension    Ankle dorsiflexion  4  Ankle plantarflexion  3-  Ankle inversion  2+  Ankle eversion  2+     LOWER EXTREMITY SPECIAL TESTS:  Ankle special tests: Talar tilt test: positive   FUNCTIONAL TESTS:  6 minute walk test: TBD Functional gait assessment: TBD  GAIT: Distance walked: 52' in clinic Assistive device utilized: None Level of assistance: Modified independence Comments: decreased stance time on L, flat foot contact L, hyperextends L knee, avoids roll off L distal toes, decreased  stride length R by 1/2 distance   TODAY'S TREATMENT:                                                                                                                              DATE: 07/20/23:  Evaluation, gentle distraction of subtalar jt, distraction tarsals. Instructed in therex as described below to improve L ankle ROM and strength, with black t band Stretch L plantarflexors with forefoot on 1" book to block rotation/ abduction of rear foot    PATIENT EDUCATION:  Education details: POC, goals Person educated: Patient Education method: Explanation, Demonstration, and Tactile cues Education comprehension: verbalized understanding, returned demonstration, and verbal cues required  HOME EXERCISE PROGRAM: Access Code: [email or web address] URL: [email or web address] Date: 07/20/2023 Prepared by: Emma-Lee Oddo  Exercises - Long Sitting Ankle Plantar Flexion with Resistance  - 1 x daily - 7 x weekly - 3 sets - 10 reps - Long Sitting Ankle Inversion with Resistance  - 1 x daily - 7 x weekly - 3 sets - 10 reps - Long Sitting Ankle Eversion with Resistance  - 1 x daily - 7 x weekly - 3 sets - 10 reps - Gastroc Stretch on Step  - 1 x daily - 7 x weekly - 3 sets - 10 reps  ASSESSMENT:  CLINICAL IMPRESSION: Patient is a 47 y.o. female who evaluated today by skilled physical therapy to assist with her recovery of function from L foot stress fracture.  Prior to her symptoms she was quite active, walked 3-5 miles several times a week, played tennis 2 x week, assisted with her 39 and 34 y.o niece and nephew.  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 She was immobilized once her diagnosis was determined , for about 3 months.  She presents with atrophied L plantarflexors and quite stiff throughout L ankle, rearfoot, forefoot, tarsals, particularly with IN/EV motions. Tends to ER L foot/ ankle with gait. Responded well today to exercise incorporating some weight bearing and resistance.  Was guarded/  hesitant with manual techniques.  Will benefit from skilled PT to address her progression, maximize her recovery  OBJECTIVE IMPAIRMENTS: decreased activity tolerance, decreased balance, decreased mobility, difficulty walking, decreased ROM, decreased strength, hypomobility, increased edema, increased fascial restrictions, impaired flexibility, and pain.   ACTIVITY LIMITATIONS: carrying, squatting, stairs, and locomotion level  PARTICIPATION LIMITATIONS: cleaning, laundry, shopping, community activity, and yard work  PERSONAL FACTORS: Age, Behavior pattern, Education, Fitness, and Time since onset of injury/illness/exacerbation are also affecting patient's functional outcome.   REHAB POTENTIAL: Good  CLINICAL DECISION MAKING: Stable/uncomplicated  EVALUATION COMPLEXITY: Low   GOALS: Goals reviewed with patient? Yes  SHORT TERM GOALS: Target date: 08/03/23 I HEP  Baseline:initiated at eval Goal status: INITIAL   LONG TERM GOALS: Target date: 09/14/23  Improve L ankle dorsiflexion AROM to 10  degrees or better, and INV, EV to 35 degrees or better for improved walking efficiency Baseline:  Goal status: INITIAL  2.  Strength L ankle plantarflexion 4/5, able to unilaterally perform L heel raise 15x for ability to walk prolonged distances Baseline:  Goal status: INITIAL  3.  FGA score 30/30 Baseline: TBD not tested initial eval due to painful gait Goal status: INITIAL  4.  6 min walk test without antalgia L Baseline: antalgic gait consistently Goal status: INITIAL    PLAN:  PT FREQUENCY: 2x/week  PT DURATION: 8 weeks  PLANNED INTERVENTIONS: Therapeutic exercises, Therapeutic activity, Neuromuscular re-education, Balance training, Gait training, Patient/Family education, Self Care, and Joint mobilization  PLAN FOR NEXT SESSION: Progress with more self mobilization instruction, manual techniques to tolerance, possibly kinesiotape for edema, progress therex as  needed.   Tyleek Smick L Costa Jha, PT 07/20/2023, 2:24 PM

## 2023-07-20 ENCOUNTER — Ambulatory Visit: Payer: 59 | Attending: Orthopedic Surgery

## 2023-07-20 ENCOUNTER — Other Ambulatory Visit: Payer: Self-pay

## 2023-07-20 DIAGNOSIS — S92902A Unspecified fracture of left foot, initial encounter for closed fracture: Secondary | ICD-10-CM | POA: Insufficient documentation

## 2023-07-20 DIAGNOSIS — R262 Difficulty in walking, not elsewhere classified: Secondary | ICD-10-CM | POA: Diagnosis not present

## 2023-07-20 DIAGNOSIS — M25672 Stiffness of left ankle, not elsewhere classified: Secondary | ICD-10-CM | POA: Diagnosis not present

## 2023-07-23 IMAGING — US US AXILLARY LEFT
1 series · 6 of 6 positions shown · non-contrast
Comparison: Previous exam(s).

CLINICAL DATA: 45-year-old presenting with cyclical intermittent
pain and a possible palpable lump in the axillary tail of the LEFT
breast/LEFT axilla, though the patient does not feel the lump
currently. Annual evaluation, RIGHT breast.

EXAM:
DIGITAL DIAGNOSTIC BILATERAL MAMMOGRAM WITH TOMOSYNTHESIS AND CAD;
US AXILLARY LEFT
TECHNIQUE: Bilateral digital diagnostic mammography and breast tomosynthesis
was performed. The images were evaluated with computer-aided
detection.; Targeted ultrasound examination of the left axilla was
performed.

[Series 1: us axillary left · 0.06mm/px · 6 of 6 slices shown]
[im 1/6]
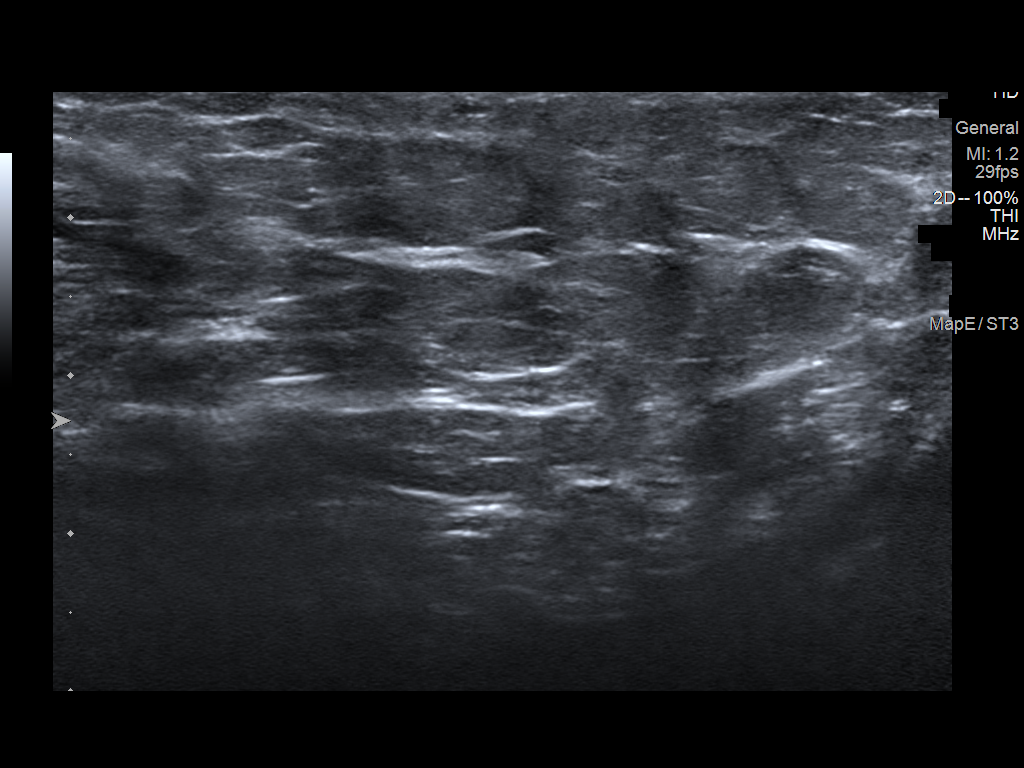
[im 2/6]
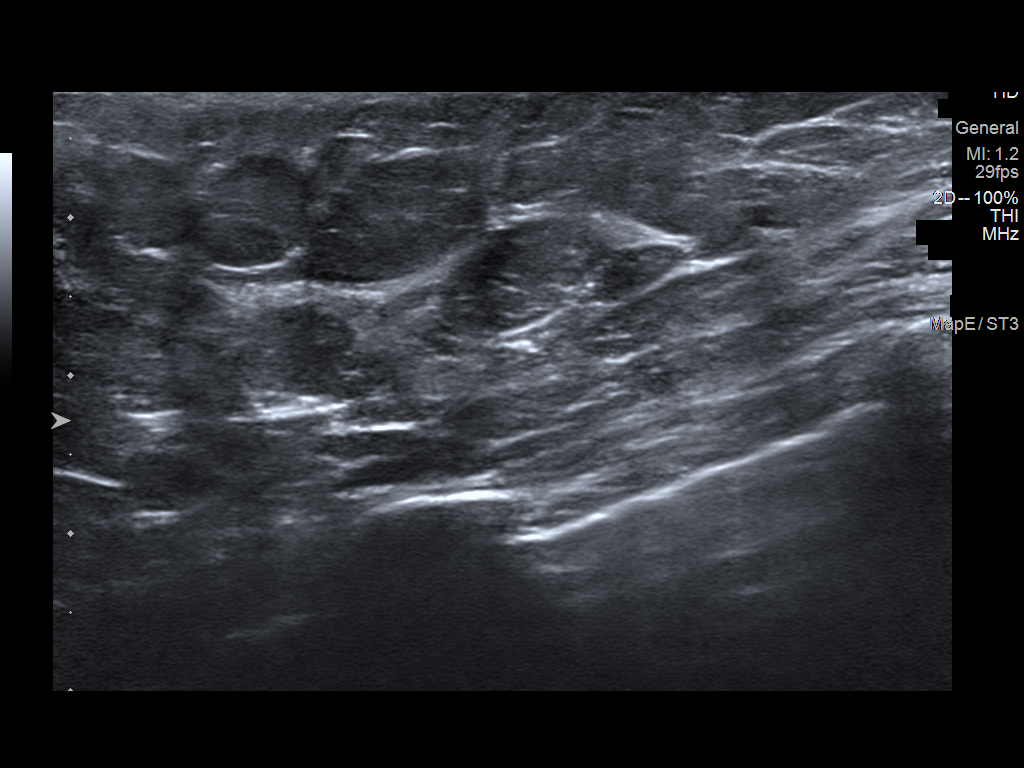
[im 3/6]
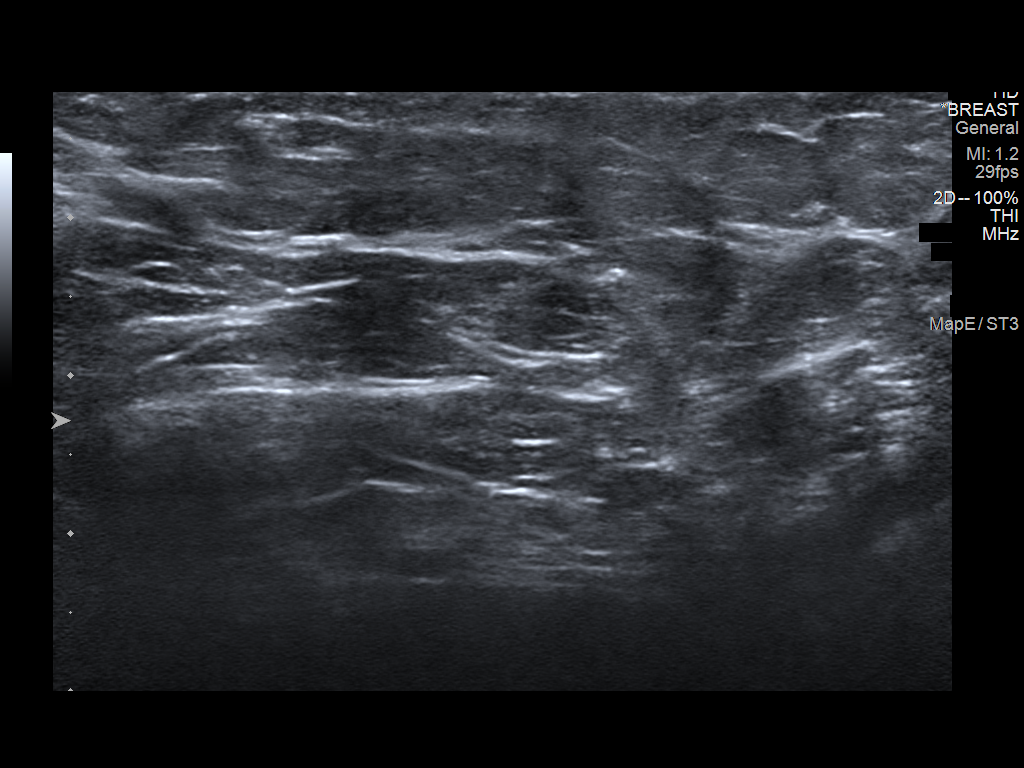
[im 4/6]
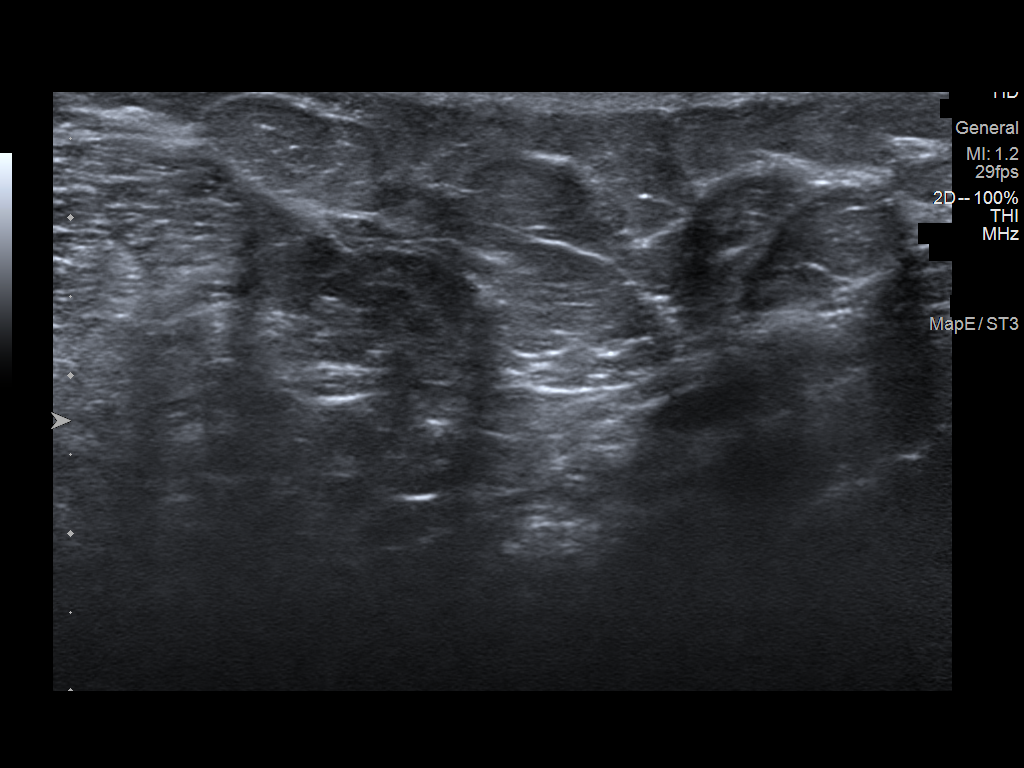
[im 5/6]
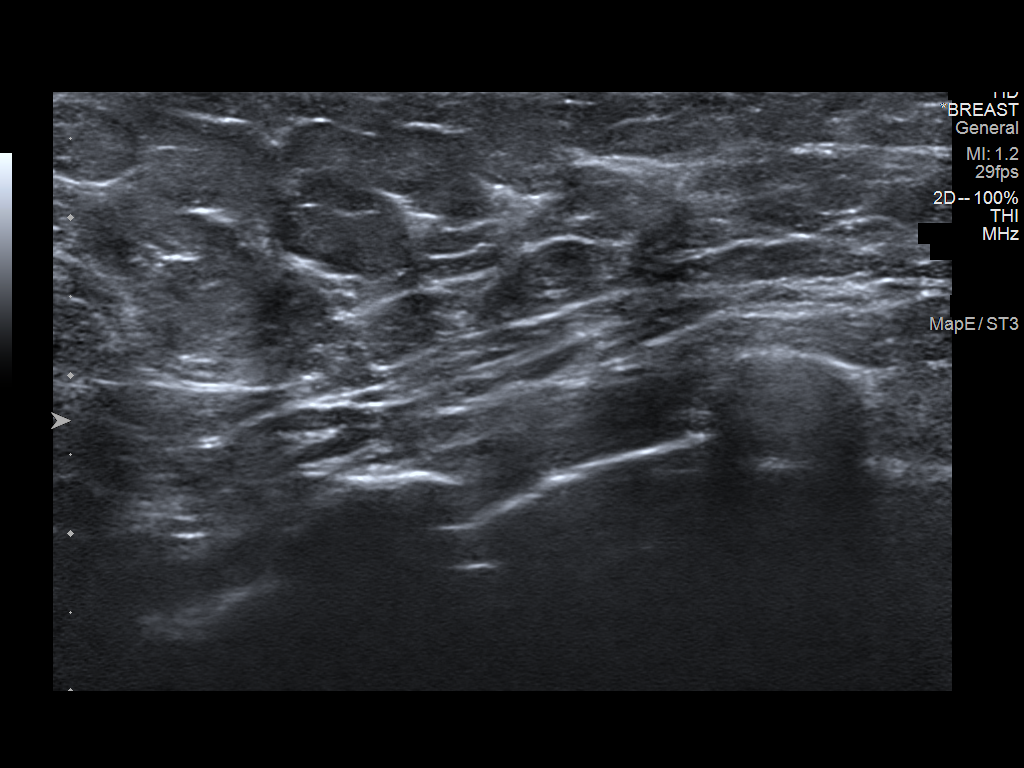
[im 6/6]
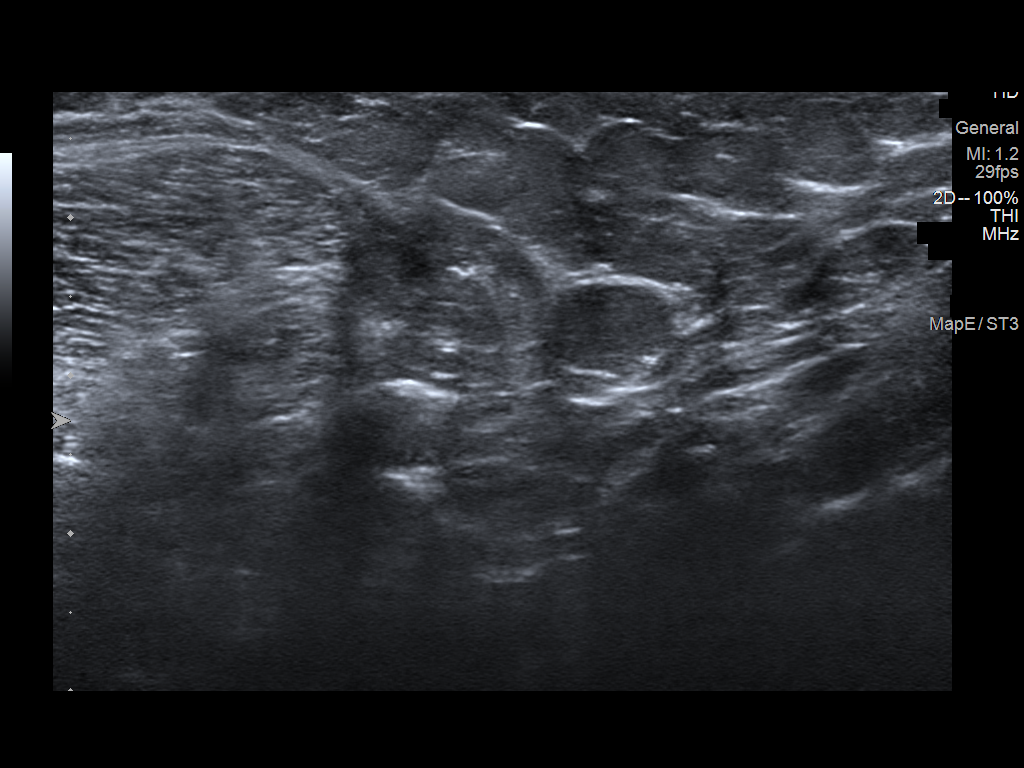

[6 of 6 positions shown; findings below may reference images not displayed]

ACR Breast Density Category c: The breast tissue is heterogeneously
dense, which may obscure small masses.
FINDINGS: Full field CC and MLO views of both breasts were obtained.

RIGHT: No findings suspicious for malignancy.

LEFT: No findings suspicious malignancy. Focally dense
fibroglandular tissue is present in the UPPER OUTER QUADRANT in the
area of focal pain.

Targeted ultrasound of the axillary tail and LEFT axilla is
performed in the area of focal pain, demonstrating normal
fibroglandular tissue in the axillary tail. There is no mass or
pathologic lymphadenopathy.
IMPRESSION: 1. No mammographic or sonographic evidence of malignancy involving
the LEFT breast.
2. No mammographic evidence of malignancy involving the RIGHT
breast.
3. No pathologic LEFT axillary lymphadenopathy.

RECOMMENDATION:
Screening mammogram in one year.(Code:EM-8-7DO)

I have discussed the findings and recommendations with the patient.
If applicable, a reminder letter will be sent to the patient
regarding the next appointment.

BI-RADS CATEGORY  1: Negative.

## 2023-07-23 IMAGING — MG DIGITAL DIAGNOSTIC BILAT W/ TOMO W/ CAD
8 series · 8 of 24 positions shown · non-contrast
Comparison: Previous exam(s).

CLINICAL DATA: 45-year-old presenting with cyclical intermittent
pain and a possible palpable lump in the axillary tail of the LEFT
breast/LEFT axilla, though the patient does not feel the lump
currently. Annual evaluation, RIGHT breast.

EXAM:
DIGITAL DIAGNOSTIC BILATERAL MAMMOGRAM WITH TOMOSYNTHESIS AND CAD;
US AXILLARY LEFT
TECHNIQUE: Bilateral digital diagnostic mammography and breast tomosynthesis
was performed. The images were evaluated with computer-aided
detection.; Targeted ultrasound examination of the left axilla was
performed.

[R CC synth-2D]
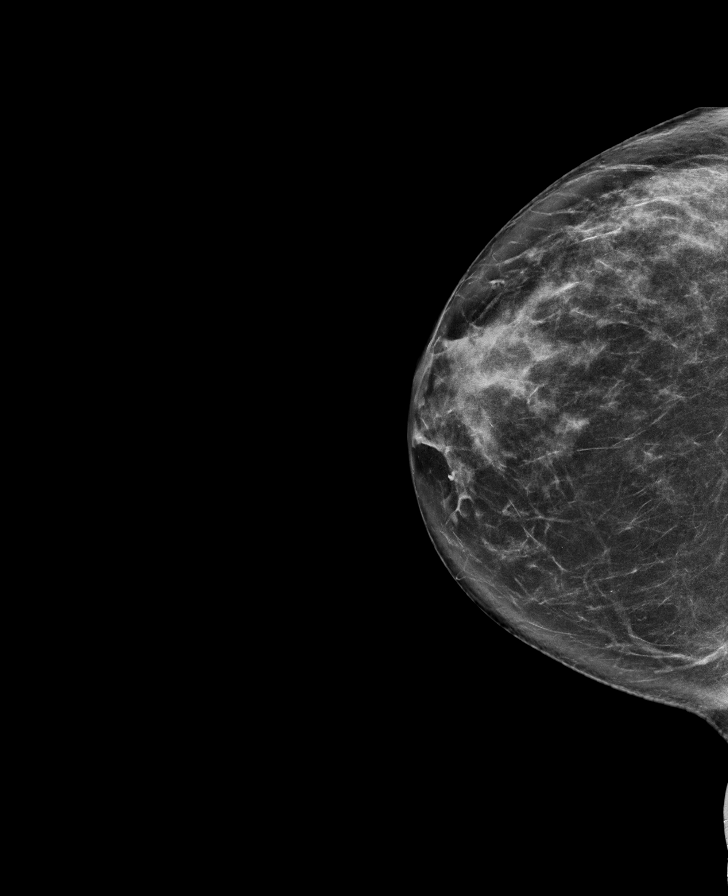

[L MLO synth-2D]
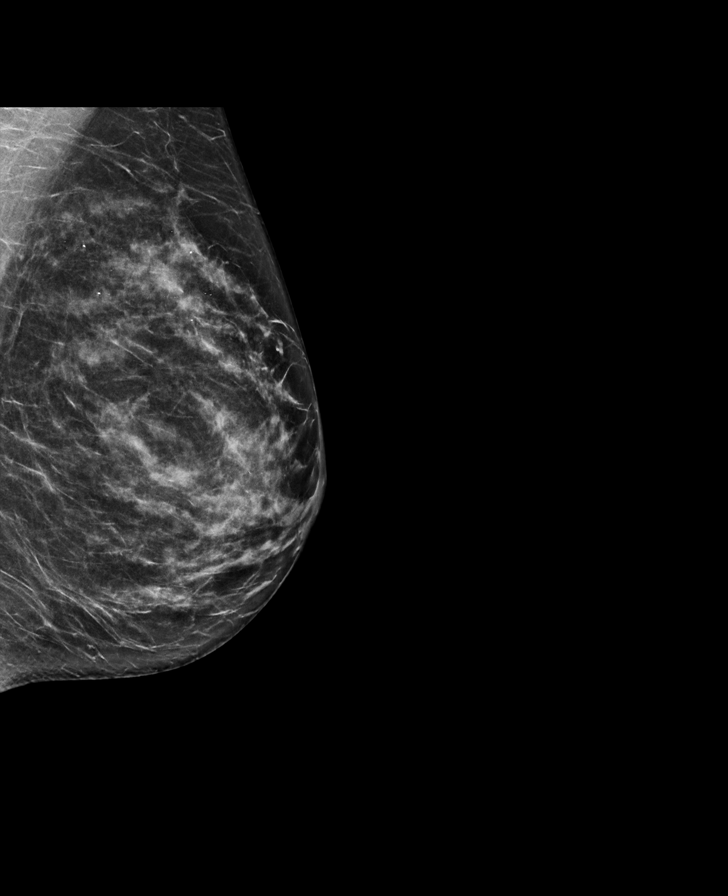

[R MLO synth-2D]
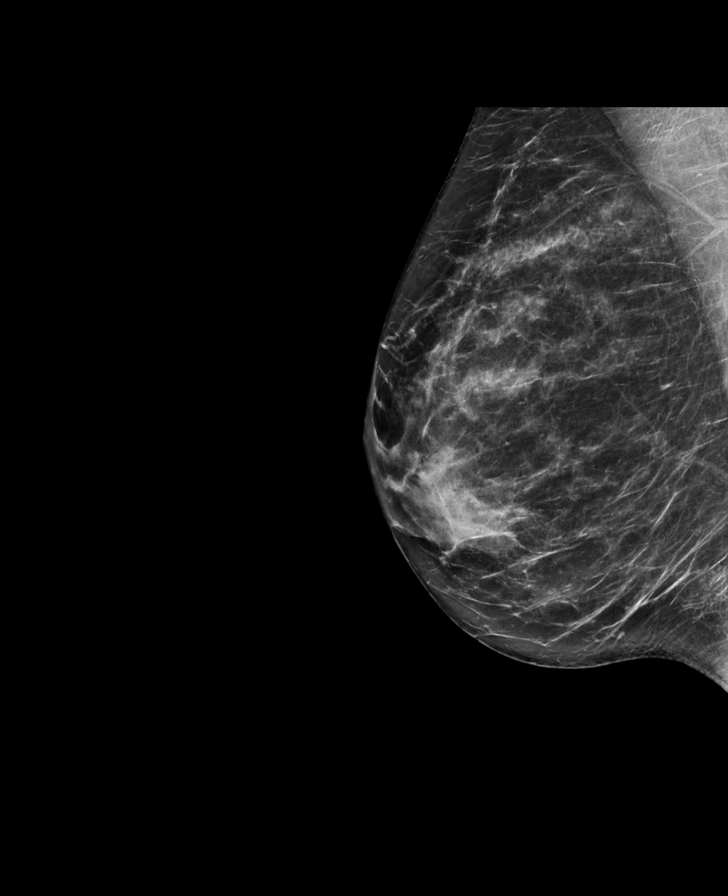

[L CC synth-2D]
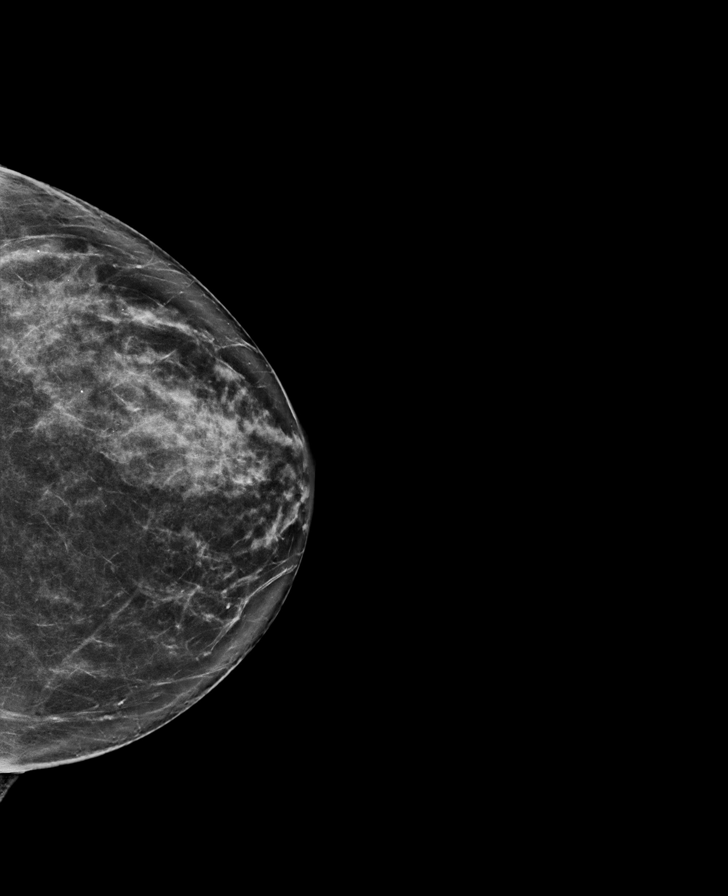

[L CC tomo · tomo slice 40/79.0]
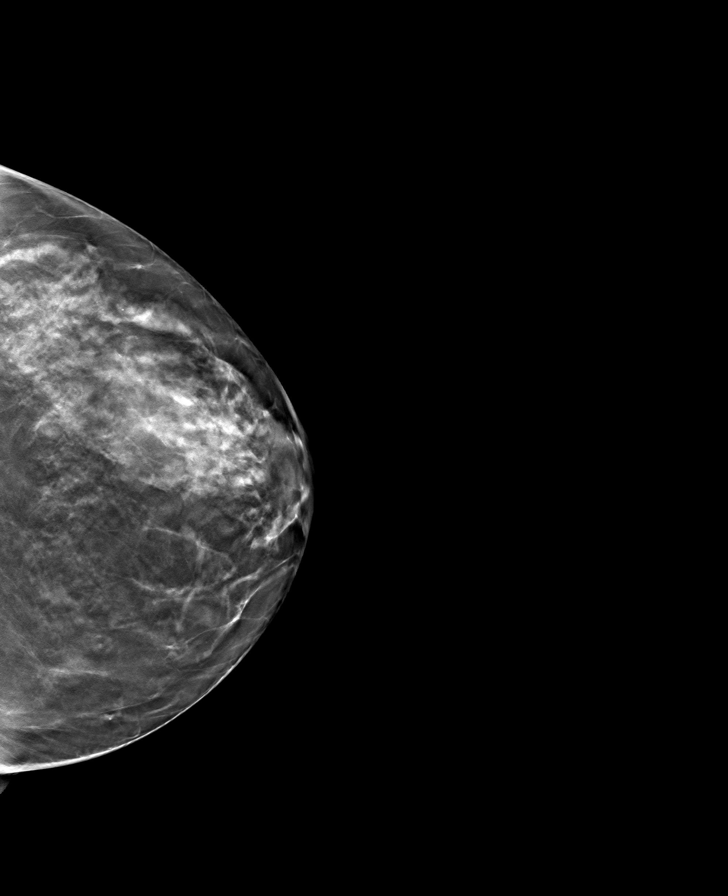

[R CC tomo · tomo slice 41/80.0]
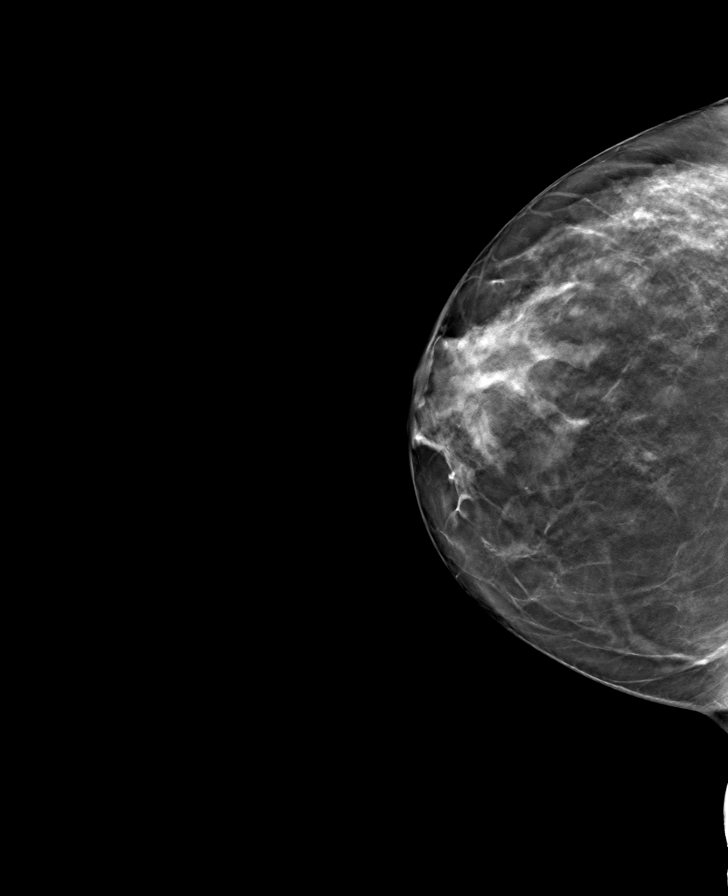

[R MLO tomo · tomo slice 39/78.0]
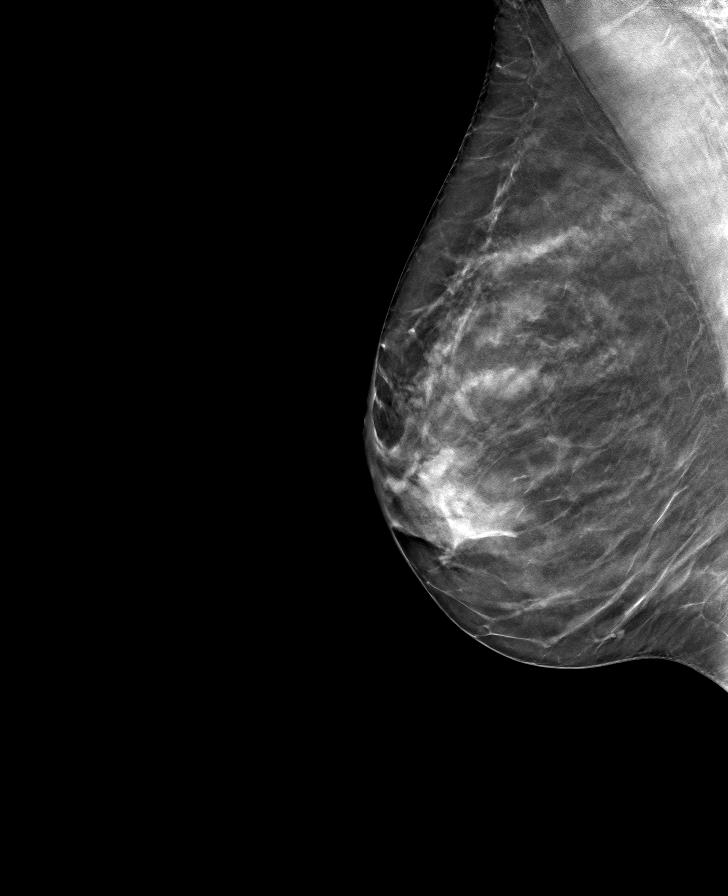

[L MLO tomo · tomo slice 37/74.0]
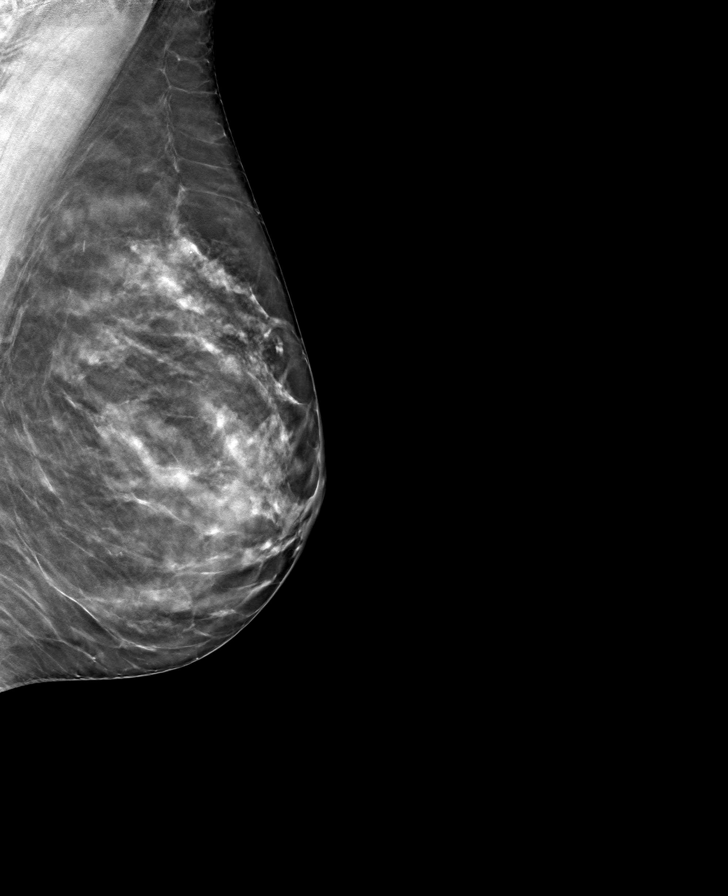

[8 of 24 positions shown; findings below may reference images not displayed]

ACR Breast Density Category c: The breast tissue is heterogeneously
dense, which may obscure small masses.
FINDINGS: Full field CC and MLO views of both breasts were obtained.

RIGHT: No findings suspicious for malignancy.

LEFT: No findings suspicious malignancy. Focally dense
fibroglandular tissue is present in the UPPER OUTER QUADRANT in the
area of focal pain.

Targeted ultrasound of the axillary tail and LEFT axilla is
performed in the area of focal pain, demonstrating normal
fibroglandular tissue in the axillary tail. There is no mass or
pathologic lymphadenopathy.
IMPRESSION: 1. No mammographic or sonographic evidence of malignancy involving
the LEFT breast.
2. No mammographic evidence of malignancy involving the RIGHT
breast.
3. No pathologic LEFT axillary lymphadenopathy.

RECOMMENDATION:
Screening mammogram in one year.(Code:[id number])

I have discussed the findings and recommendations with the patient.
If applicable, a reminder letter will be sent to the patient
regarding the next appointment.

BI-RADS CATEGORY  1: Negative.

## 2023-07-25 ENCOUNTER — Ambulatory Visit: Payer: 59 | Attending: Orthopedic Surgery | Admitting: Physical Therapy

## 2023-07-25 ENCOUNTER — Encounter: Payer: Self-pay | Admitting: Physical Therapy

## 2023-07-25 DIAGNOSIS — M25672 Stiffness of left ankle, not elsewhere classified: Secondary | ICD-10-CM | POA: Diagnosis not present

## 2023-07-25 DIAGNOSIS — S92902A Unspecified fracture of left foot, initial encounter for closed fracture: Secondary | ICD-10-CM | POA: Diagnosis not present

## 2023-07-25 DIAGNOSIS — R262 Difficulty in walking, not elsewhere classified: Secondary | ICD-10-CM | POA: Insufficient documentation

## 2023-07-25 NOTE — Therapy (Signed)
OUTPATIENT PHYSICAL THERAPY TREATMENT   Patient Name: Debra Downs MRN: [id number] DOB:1976/08/27, 47 y.o., female Today's Date: 07/25/2023  END OF SESSION:  PT End of Session - 07/25/23 0803     Visit Number 2    Date for PT Re-Evaluation 09/14/23    Progress Note Due on Visit 10    PT Start Time 0803    PT Stop Time 0857    PT Time Calculation (min) 54 min    Activity Tolerance Patient tolerated treatment well    Behavior During Therapy [facility] for tasks assessed/performed             Past Medical History:  Diagnosis Date   GERD (gastroesophageal reflux disease)    Seborrheic dermatitis    Past Surgical History:  Procedure Laterality Date   LASIK  2007   Patient Active Problem List   Diagnosis Date Noted   Left foot pain 04/24/2013    PCP: Darrow Bussing, MD  REFERRING PROVIDER: Toni Arthurs, MD  REFERRING DIAG: L midfoot stress fracture  THERAPY DIAG:  Difficulty in walking, not elsewhere classified  Fracture, foot, left, closed, initial encounter  Stiffness of left ankle, not elsewhere classified  Rationale for Evaluation and Treatment: Rehabilitation  ONSET DATE: one year, 2023  NEXT MD VISIT: none scheduled with orthopedist   SUBJECTIVE:   SUBJECTIVE STATEMENT: Pt reports some increased pain this morning as she is just getting off work and has been on her feet a lot.  EVAL: Pain L ant ankle foot, L tibialis anterior cramps after prolonged walking, also painful walking, weight bearing on uneven surfaces  PERTINENT HISTORY: H/o L foot swelling , pain for a year.  Saw DPM, treated for tendonitis.  Saw orthopedist had MRI, in hard cast for 6 weeks with scooter, then walking boot for 6 weeks.  In compression sock and regular tennis shoes now for 2 months. Orthopedic MD orders state L ankle ROM, strengthening, gait training, dry needling L tibialis anterior PAIN:  Are you having pain? Yes: NPRS scale: 2/10 Pain location: L navicular  cuboids Pain description: tender, swollen Aggravating factors: prolonged standing/Wb'ing, uneven terrain, turning foot side to side Relieving factors: compression garment , elevation  PRECAUTIONS: None  RED FLAGS: None   WEIGHT BEARING RESTRICTIONS: No  FALLS:  Has patient fallen in last 6 months? 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  PALPATION: Tender over navicular and medial cuneiform  LOWER EXTREMITY ROM:            ROM R   A     L    A/P  Ankle dorsiflexion 14 -10/0  Ankle plantarflexion 78 60/60  Ankle inversion 70 5/5  Ankle eversion 35 5/5  L gr toe extension 30 degrees  LOWER EXTREMITY MMT:  ALL WFL unless otherwise noted:   MMT Right eval  Left eval  Hip flexion    Hip extension    Hip abduction    Hip adduction    Hip internal rotation    Hip external rotation    Knee flexion    Knee extension    Ankle dorsiflexion  4  Ankle plantarflexion  3-  Ankle inversion  2+  Ankle eversion  2+     LOWER EXTREMITY SPECIAL TESTS:  Ankle special tests: Talar tilt test: positive   FUNCTIONAL TESTS:  6 minute walk test: TBD Functional gait assessment: TBD  GAIT: Distance walked: 26' in clinic Assistive device utilized: None Level of assistance: Modified independence Comments: decreased stance time  on L, flat foot contact L, hyperextends L knee, avoids roll off L distal toes, decreased stride length R by 1/2 distance   TODAY'S TREATMENT:                                                                                                                              DATE:   07/25/23 THERAPEUTIC EXERCISE: to improve flexibility, strength and mobility.  Demonstration, verbal and tactile cues throughout for technique.  Rec Bike - L1 x 6 min L ankle black TB PF & DF 2 x 10 each L ankle inversion and eversion isometrics into ball 10 x 5" each L PF stretch forefoot on 1" book to block rotation/ abduction of rear foot 3 x 30" L DF mob with black TB at ankle in L PF stretch with & w/o 1" book 2 x 30" each  MANUAL THERAPY: To promote improved flexibility, improved joint mobility, increased ROM, reduced pain, and reduced edema. Gentle distraction of subtalar jt L foot Distraction tarsals Grade II-III A/P subtalar joint mobs L metatarsal mobs Retrograde massage for edema reduction   MODALITIES: Game Ready vasopneumatic compression post session to L foot and ankle x 10 min, medium compression, 34 to reduce post-exercise pain and swelling/edema   07/20/23:  Evaluation, gentle distraction of subtalar jt, distraction tarsals. Instructed in therex as described below to improve L ankle ROM and strength, with black t band Stretch L plantarflexors with forefoot on 1" book to block rotation/ abduction of rear foot    PATIENT EDUCATION:  Education details: HEP review and HEP modification - ankle IV/EV switch to isometrics   Person educated: Patient Education method: Explanation, Demonstration, Tactile cues, Verbal cues, and Handouts Education comprehension: verbalized understanding, returned demonstration, verbal cues required, tactile cues required, and needs further education  HOME EXERCISE PROGRAM: Access Code: [email or web address] URL: [email or web address] Date: 07/25/2023 Prepared by:  Glenetta Hew  Exercises - Gastroc Stretch on Step  - 2-3 x daily - 7 x weekly - 1 sets - 3 reps - 30 sec hold - Long Sitting Ankle Plantar Flexion with Resistance  - 1 x daily - 7 x weekly - 3 sets - 10 reps - 3 sec hold - Seated Ankle Dorsiflexion with Resistance  - 1 x daily - 7 x weekly -  2 sets - 10 reps - 3 sec hold - Isometric Ankle Inversion  - 1 x daily - 7 x weekly - 2 sets - 10 reps - 3-5 sec hold - Isometric Ankle Eversion at Wall  - 1 x daily - 7 x weekly - 2 sets - 10 reps - 3-5 sec hold  ASSESSMENT:  CLINICAL IMPRESSION: Debra Downs" reports she is having difficulty isolating inversion and eversion movement patterns during her HEP.  With HEP review she demonstrates substitution with hip IR/ER when attempting to use black TB for resisted L ankle inversion and eversion, therefore switched these to isometric muscle activation using ball for resistance with patient noting better awareness of muscle engagement.  She remains very stiff in L with limited joint play noted during mobilization and manual techniques.  Session concluded with GR vasopneumatic compression to reduce post exercise pain and especially after just coming off of full nightshift at work.  Debra Downs will benefit from continued skilled PT to address above deficits to improve mobility and activity tolerance with decreased pain interference.   OBJECTIVE IMPAIRMENTS: decreased activity tolerance, decreased balance, decreased mobility, difficulty walking, decreased ROM, decreased strength, hypomobility, increased edema, increased fascial restrictions, impaired flexibility, and pain.   ACTIVITY LIMITATIONS: carrying, squatting, stairs, and locomotion level  PARTICIPATION LIMITATIONS: cleaning, laundry, shopping, community activity, and yard work  PERSONAL FACTORS: Age, Behavior pattern, Education, Fitness, and Time since onset of injury/illness/exacerbation are also affecting patient's functional outcome.   REHAB  POTENTIAL: Good  CLINICAL DECISION MAKING: Stable/uncomplicated  EVALUATION COMPLEXITY: Low   GOALS: Goals reviewed with patient? Yes  SHORT TERM GOALS: Target date: 08/03/23 I HEP  Baseline:initiated at eval Goal status: IN PROGRESS   LONG TERM GOALS: Target date: 09/14/23  Improve L ankle dorsiflexion AROM to 10  degrees or better, and INV, EV to 35 degrees or better for improved walking efficiency Baseline:  Goal status: INITIAL  2.  Strength L ankle plantarflexion 4/5, able to unilaterally perform L heel raise 15x for ability to walk prolonged distances Baseline:  Goal status: INITIAL  3.  FGA score 30/30 Baseline: TBD not tested initial eval due to painful gait Goal status: INITIAL  4.  6 min walk test without antalgia L Baseline: antalgic gait consistently Goal status: INITIAL   PLAN:  PT FREQUENCY: 2x/week  PT DURATION: 8 weeks  PLANNED INTERVENTIONS: Therapeutic exercises, Therapeutic activity, Neuromuscular re-education, Balance training, Gait training, Patient/Family education, Self Care, and Joint mobilization  PLAN FOR NEXT SESSION: Progress with more self mobilization instruction, manual techniques to tolerance, possibly kinesiotape for edema, progress therex as needed.   Debra Downs, PT 07/25/2023, 2:34 PM

## 2023-07-27 ENCOUNTER — Encounter: Payer: Self-pay | Admitting: Physical Therapy

## 2023-07-27 ENCOUNTER — Ambulatory Visit: Payer: 59 | Admitting: Physical Therapy

## 2023-07-27 DIAGNOSIS — S92902A Unspecified fracture of left foot, initial encounter for closed fracture: Secondary | ICD-10-CM | POA: Diagnosis not present

## 2023-07-27 DIAGNOSIS — M25672 Stiffness of left ankle, not elsewhere classified: Secondary | ICD-10-CM

## 2023-07-27 DIAGNOSIS — R262 Difficulty in walking, not elsewhere classified: Secondary | ICD-10-CM | POA: Diagnosis not present

## 2023-07-27 NOTE — Therapy (Signed)
OUTPATIENT PHYSICAL THERAPY TREATMENT   Patient Name: Debra Downs MRN: [id number] DOB:January 18, 1976, 47 y.o., female Today's Date: 07/27/2023  END OF SESSION:  PT End of Session - 07/27/23 0929     Visit Number 3    Date for PT Re-Evaluation 09/14/23    Authorization Type [facility] [employer]    Progress Note Due on Visit 10    PT Start Time 0929    PT Stop Time 1027    PT Time Calculation (min) 58 min    Activity Tolerance Patient tolerated treatment well    Behavior During Therapy WFL for tasks assessed/performed              Past Medical History:  Diagnosis Date   GERD (gastroesophageal reflux disease)    Seborrheic dermatitis    Past Surgical History:  Procedure Laterality Date   LASIK  2007   Patient Active Problem List   Diagnosis Date Noted   Left foot pain 04/24/2013    PCP: Darrow Bussing, MD  REFERRING PROVIDER: Toni Arthurs, MD  REFERRING DIAG: L midfoot stress fracture  THERAPY DIAG:  Difficulty in walking, not elsewhere classified  Fracture, foot, left, closed, initial encounter  Stiffness of left ankle, not elsewhere classified  Rationale for Evaluation and Treatment: Rehabilitation  ONSET DATE: one year, 2023  NEXT MD VISIT: none scheduled with orthopedist   SUBJECTIVE:   SUBJECTIVE STATEMENT: Pt denies pain and states she did not take an ibuprofen before coming to PT.  EVAL: Pain L ant ankle foot, L tibialis anterior cramps after prolonged walking, also painful walking, weight bearing on uneven surfaces  PERTINENT HISTORY: H/o L foot swelling , pain for a year.  Saw DPM, treated for tendonitis.  Saw orthopedist had MRI, in hard cast for 6 weeks with scooter, then walking boot for 6 weeks.  In compression sock and regular tennis shoes now for 2 months. Orthopedic MD orders state L ankle ROM, strengthening, gait training, dry needling L tibialis anterior PAIN:  Are you having pain? No  PRECAUTIONS: None  RED FLAGS: None   WEIGHT  BEARING RESTRICTIONS: No  FALLS:  Has patient fallen in last 6 months? 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  PALPATION: Tender over navicular and medial cuneiform  LOWER EXTREMITY ROM:            ROM R   A     L    A/P L  A/P  Ankle dorsiflexion 14 -10/0 8/14  Ankle plantarflexion 78 60/60   Ankle inversion 70 5/5 9/12  Ankle eversion 35 5/5 7/14  L gr toe extension 30 degrees  LOWER EXTREMITY MMT:  ALL WFL unless otherwise noted:   MMT Right eval Left eval  Hip flexion    Hip extension    Hip abduction    Hip adduction  Hip internal rotation    Hip external rotation    Knee flexion    Knee extension    Ankle dorsiflexion  4  Ankle plantarflexion  3-  Ankle inversion  2+  Ankle eversion  2+     LOWER EXTREMITY SPECIAL TESTS:  Ankle special tests: Talar tilt test: positive   FUNCTIONAL TESTS:  6 minute walk test: TBD Functional gait assessment: TBD  GAIT: Distance walked: 69' in clinic Assistive device utilized: None Level of assistance: Modified independence Comments: decreased stance time on L, flat foot contact L, hyperextends L knee, avoids roll off L distal toes, decreased stride length R by 1/2 distance   TODAY'S TREATMENT:                                                                                                                               DATE:   07/27/23 THERAPEUTIC EXERCISE: to improve flexibility, strength and mobility.  Demonstration, verbal and tactile cues throughout for technique.  Rec Bike - L2 x 6 min Kneeling L subtalar mobs with towel + DF stretch 3 x 30 sec Long-sitting L ankle CW/CCW circles 2 x 10 each direction Long-sitting L ankle D1/2 PNF diagonals 2 x 10 each direction L SLS 3 x 10-15 sec, 1 pole A for balance L SLS + 5-way star/clock taps to colored dots x 10, 1 pole A for balance Standing heel/toe raises x 20 - increased shift to R LE noted L ankle inversion and eversion isometrics into ball 10 x 5" each, 2 sets  MANUAL THERAPY: To promote improved flexibility, improved joint mobility, increased ROM, reduced pain, and reduced edema. Gentle distraction of subtalar jt L foot Distraction tarsals Grade II-III A/P subtalar joint mobs L tarsal/metatarsal mobs Retrograde massage for edema reduction  MODALITIES: Game Ready vasopneumatic compression post session to L ankle x 10 min, high compression, 34 to reduce post-exercise pain and swelling/edema   07/25/23 THERAPEUTIC EXERCISE: to improve flexibility, strength and mobility.  Demonstration, verbal and tactile cues throughout for technique.  Rec Bike - L1 x 6 min L ankle black TB PF & DF 2 x 10 each L ankle inversion and eversion isometrics into ball 10 x 5" each L PF stretch forefoot on 1" book to block rotation/ abduction of rear foot 3 x 30" L DF mob with black TB at ankle in L PF stretch with & w/o 1" book 2 x 30" each  MANUAL THERAPY: To promote improved flexibility, improved joint mobility, increased ROM, reduced pain, and reduced edema. Gentle distraction of subtalar jt L foot Distraction tarsals Grade II-III A/P subtalar joint mobs L metatarsal mobs Retrograde massage for edema reduction  STM to L ant tib, peroneals, and gastroc/soleus  MODALITIES: Game Ready vasopneumatic compression  post session to L foot and ankle x 10 min, medium compression, 34 to reduce post-exercise pain and swelling/edema   07/20/23:  Evaluation, gentle distraction of subtalar jt, distraction tarsals. Instructed in  therex as described below to improve L ankle ROM and strength, with black t band Stretch L plantarflexors with forefoot on 1" book to block rotation/ abduction of rear foot    PATIENT EDUCATION:  Education details: HEP review and HEP modification - ankle IV/EV switch to isometrics   Person educated: Patient Education method: Explanation, Demonstration, Tactile cues, Verbal cues, and Handouts Education comprehension: verbalized understanding, returned demonstration, verbal cues required, tactile cues required, and needs further education  HOME EXERCISE PROGRAM: Access Code: [email or web address] URL: [email or web address] Date: 07/25/2023 Prepared by: Glenetta Hew  Exercises - Gastroc Stretch on Step  - 2-3 x daily - 7 x weekly - 1 sets - 3 reps - 30 sec hold - Long Sitting Ankle Plantar Flexion with Resistance  - 1 x daily - 7 x weekly - 3 sets - 10 reps - 3 sec hold - Seated Ankle Dorsiflexion with Resistance  - 1 x daily - 7 x weekly - 2 sets - 10 reps - 3 sec hold - Isometric Ankle Inversion  - 1 x daily - 7 x weekly - 2 sets - 10 reps - 3-5 sec hold - Isometric Ankle Eversion at Wall  - 1 x daily - 7 x weekly - 2 sets - 10 reps - 3-5 sec hold  ASSESSMENT:  CLINICAL IMPRESSION: [facility] [place]" notes improving walking tolerance with less muscle cramping over the past week or so. She states the isometric ankle inversion/eversion seems easier with better awareness of the muscle activation. L ankle ROM improving with greatest gains in L ankle DF. Introduced multiplanar L ankle ROM with good tolerance but more limited control/movement on D1 diagonal. Initiated proprioceptive training with SLS and star/clock reaches with 1 pole A necessary for balance control. Session concluded  with GR vasopneumatic compression to reduce post exercise pain and especially after just coming off of full nightshift at work.  Marcelino Duster will benefit from continued skilled PT to address above deficits to improve mobility and activity tolerance with decreased pain interference.   OBJECTIVE IMPAIRMENTS: decreased activity tolerance, decreased balance, decreased mobility, difficulty walking, decreased ROM, decreased strength, hypomobility, increased edema, increased fascial restrictions, impaired flexibility, and pain.   ACTIVITY LIMITATIONS: carrying, squatting, stairs, and locomotion level  PARTICIPATION LIMITATIONS: cleaning, laundry, shopping, community activity, and yard work  PERSONAL FACTORS: Age, Behavior pattern, Education, Fitness, and Time since onset of injury/illness/exacerbation are also affecting patient's functional outcome.   REHAB POTENTIAL: Good  CLINICAL DECISION MAKING: Stable/uncomplicated  EVALUATION COMPLEXITY: Low   GOALS: Goals reviewed with patient? Yes  SHORT TERM GOALS: Target date: 08/03/23 I HEP  Baseline:initiated at eval Goal status: IN PROGRESS   LONG TERM GOALS: Target date: 09/14/23  Improve L ankle dorsiflexion AROM to 10  degrees or better, and INV, EV to 35 degrees or better for improved walking efficiency Baseline:  Goal status: INITIAL  2.  Strength L ankle plantarflexion 4/5, able to unilaterally perform L heel raise 15x for ability to walk prolonged distances Baseline:  Goal status: INITIAL  3.  FGA score 30/30 Baseline: TBD not tested initial eval due to painful gait Goal status: INITIAL  4.  6 min walk test without antalgia L Baseline: antalgic gait consistently Goal status: INITIAL   PLAN:  PT FREQUENCY: 2x/week  PT DURATION: 8 weeks  PLANNED INTERVENTIONS: Therapeutic exercises, Therapeutic activity, Neuromuscular re-education, Balance training, Gait training, Patient/Family education, Self Care, and Joint  mobilization  PLAN FOR NEXT SESSION: Progress with more self mobilization instruction, manual techniques to tolerance, possibly  kinesiotape for edema, progress therex as needed.   Marry Guan, PT 07/27/2023, 10:22 AM

## 2023-08-01 ENCOUNTER — Other Ambulatory Visit: Payer: Self-pay

## 2023-08-01 ENCOUNTER — Ambulatory Visit: Payer: 59

## 2023-08-01 DIAGNOSIS — M25672 Stiffness of left ankle, not elsewhere classified: Secondary | ICD-10-CM

## 2023-08-01 DIAGNOSIS — S92902A Unspecified fracture of left foot, initial encounter for closed fracture: Secondary | ICD-10-CM | POA: Diagnosis not present

## 2023-08-01 DIAGNOSIS — R262 Difficulty in walking, not elsewhere classified: Secondary | ICD-10-CM

## 2023-08-01 NOTE — Therapy (Signed)
OUTPATIENT PHYSICAL THERAPY TREATMENT   Patient Name: Debra Downs MRN: [id number] DOB:August 17, 1976, 47 y.o., female Today's Date: 08/01/2023  END OF SESSION:  PT End of Session - 08/01/23 1309     Visit Number 4    Date for PT Re-Evaluation 09/14/23    Authorization Type [facility] [employer]    Progress Note Due on Visit 10    PT Start Time [id number]    PT Stop Time 0930    PT Time Calculation (min) 47 min    Activity Tolerance Patient tolerated treatment well    Behavior During Therapy WFL for tasks assessed/performed               Past Medical History:  Diagnosis Date   GERD (gastroesophageal reflux disease)    Seborrheic dermatitis    Past Surgical History:  Procedure Laterality Date   LASIK  2007   Patient Active Problem List   Diagnosis Date Noted   Left foot pain 04/24/2013    PCP: Darrow Bussing, MD  REFERRING PROVIDER: Toni Arthurs, MD  REFERRING DIAG: L midfoot stress fracture  THERAPY DIAG:  Difficulty in walking, not elsewhere classified  Fracture, foot, left, closed, initial encounter  Stiffness of left ankle, not elsewhere classified  Rationale for Evaluation and Treatment: Rehabilitation  ONSET DATE: one year, 2023  NEXT MD VISIT: none scheduled with orthopedist   SUBJECTIVE:   SUBJECTIVE STATEMENT: Pt with pain at end of day, swelling, primarily medial ankle/foot  EVAL: Pain L ant ankle foot, L tibialis anterior cramps after prolonged walking, also painful walking, weight bearing on uneven surfaces  PERTINENT HISTORY: H/o L foot swelling , pain for a year.  Saw DPM, treated for tendonitis.  Saw orthopedist had MRI, in hard cast for 6 weeks with scooter, then walking boot for 6 weeks.  In compression sock and regular tennis shoes now for 2 months. Orthopedic MD orders state L ankle ROM, strengthening, gait training, dry needling L tibialis anterior PAIN:  Are you having pain? No  PRECAUTIONS: None  RED FLAGS: None   WEIGHT BEARING  RESTRICTIONS: No  FALLS:  Has patient fallen in last 6 months? 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  PALPATION: Tender over navicular and medial cuneiform  LOWER EXTREMITY ROM:            ROM R   A     L    A/P L  A/P  Ankle dorsiflexion 14 -10/0 8/14  Ankle plantarflexion 78 60/60   Ankle inversion 70 5/5 9/12  Ankle eversion 35 5/5 7/14  L gr toe extension 30 degrees  LOWER EXTREMITY MMT:  ALL WFL unless otherwise noted:   MMT Right eval Left eval  Hip flexion    Hip extension    Hip abduction    Hip adduction    Hip  internal rotation    Hip external rotation    Knee flexion    Knee extension    Ankle dorsiflexion  4  Ankle plantarflexion  3-  Ankle inversion  2+  Ankle eversion  2+     LOWER EXTREMITY SPECIAL TESTS:  Ankle special tests: Talar tilt test: positive   FUNCTIONAL TESTS:  6 minute walk test: TBD Functional gait assessment: TBD  GAIT: Distance walked: 21' in clinic Assistive device utilized: None Level of assistance: Modified independence Comments: decreased stance time on L, flat foot contact L, hyperextends L knee, avoids roll off L distal toes, decreased stride length R by 1/2 distance   TODAY'S TREATMENT:                                                                                                                               DATE:  08/01/23:   Manual:   Gr 2/3 jt mobs, initially mobilization with movement for L ankle subtalar jt with lunges with L LE on foot stool, 3 sets 15 reps Inferior/superior jt mobs, mid foot, emphasis on lateral foot, combined with inversion motion.   Distraction of L calcaneus combined with plantarflexion stretch Distraction of mid foot and tarsals  Therex: modified home program to emphasize stretching of L ankle into dorsiflexion and avoiding medial foot/ ankle overpronation pressure.  Utilized 2" book, for B dorsiflexion stretch, with theraband around ankles , for isometric push into inversion B while performing  B foot drop. Performed to fatigue, cues frequently to keep hips forward to isolate stretch B plantarflexors. 07/27/23 THERAPEUTIC EXERCISE: to improve flexibility, strength and mobility.  Demonstration, verbal and tactile cues throughout for technique.  Rec Bike - L2 x 6 min Kneeling L subtalar mobs with towel + DF stretch 3 x 30 sec Long-sitting L ankle CW/CCW circles 2 x 10 each direction Long-sitting L ankle D1/2 PNF diagonals 2 x 10 each direction L SLS 3 x 10-15 sec, 1 pole A for balance L SLS + 5-way star/clock taps to colored dots x 10, 1 pole A for balance Standing heel/toe raises x 20 - increased shift to R LE noted L ankle inversion and eversion isometrics into ball 10 x 5" each, 2 sets  MANUAL THERAPY: To promote improved flexibility, improved joint mobility, increased ROM, reduced pain, and reduced edema. Gentle distraction of subtalar jt L foot Distraction tarsals Grade II-III A/P subtalar joint mobs L tarsal/metatarsal mobs Retrograde massage for edema reduction  MODALITIES: Game Ready vasopneumatic compression post session to L ankle x 10 min, high compression, 34 to reduce post-exercise pain and swelling/edema   07/25/23 THERAPEUTIC EXERCISE: to improve flexibility, strength and mobility.  Demonstration,  verbal and tactile cues throughout for technique.  Rec Bike - L1 x 6 min L ankle black TB PF & DF 2 x 10 each L ankle inversion and eversion isometrics into ball 10 x 5" each L PF stretch forefoot on 1" book to block  rotation/ abduction of rear foot 3 x 30" L DF mob with black TB at ankle in L PF stretch with & w/o 1" book 2 x 30" each  MANUAL THERAPY: To promote improved flexibility, improved joint mobility, increased ROM, reduced pain, and reduced edema. Gentle distraction of subtalar jt L foot Distraction tarsals Grade II-III A/P subtalar joint mobs L metatarsal mobs Retrograde massage for edema reduction  STM to L ant tib, peroneals, and gastroc/soleus  MODALITIES: Game Ready vasopneumatic compression post session to L foot and ankle x 10 min, medium compression, 34 to reduce post-exercise pain and swelling/edema   07/20/23:  Evaluation, gentle distraction of subtalar jt, distraction tarsals. Instructed in therex as described below to improve L ankle ROM and strength, with black t band Stretch L plantarflexors with forefoot on 1" book to block rotation/ abduction of rear foot    PATIENT EDUCATION:  Education details: HEP review and HEP modification - ankle IV/EV switch to isometrics   Person educated: Patient Education method: Explanation, Demonstration, Tactile cues, Verbal cues, and Handouts Education comprehension: verbalized understanding, returned demonstration, verbal cues required, tactile cues required, and needs further education  HOME EXERCISE PROGRAM: Access Code: [email or web address] URL: [email or web address] Date: 07/25/2023 Prepared by: Glenetta Hew  Exercises - Gastroc Stretch on Step  - 2-3 x daily - 7 x weekly - 1 sets - 3 reps - 30 sec hold - Long Sitting Ankle Plantar Flexion with Resistance  - 1 x daily - 7 x weekly - 3 sets - 10 reps - 3 sec hold - Seated Ankle Dorsiflexion with Resistance  - 1 x daily - 7 x weekly - 2 sets - 10 reps - 3 sec hold -  Isometric Ankle Inversion  - 1 x daily - 7 x weekly - 2 sets - 10 reps - 3-5 sec hold - Isometric Ankle Eversion at Wall  - 1 x daily - 7 x weekly - 2 sets - 10 reps - 3-5 sec hold  ASSESSMENT:  CLINICAL IMPRESSION: Debra Downs"  is attending skilled PT to address L foot/ ankle pain. Her INV/EV mobility is diminished , therefore addressed her subtalar and foot joint mobility with a variety of manual techniques. Also adapted her home routine. She had improved gait pattern at end of session with less foot eversion noted. Debra Downs will benefit from continued skilled PT to address above deficits to improve mobility and activity tolerance with decreased pain interference.   OBJECTIVE IMPAIRMENTS: decreased activity tolerance, decreased balance, decreased mobility, difficulty walking, decreased ROM, decreased strength, hypomobility, increased edema, increased fascial restrictions, impaired flexibility, and pain.   ACTIVITY LIMITATIONS: carrying, squatting, stairs, and locomotion level  PARTICIPATION LIMITATIONS: cleaning, laundry, shopping, community activity, and yard work  PERSONAL FACTORS: Age, Behavior pattern, Education, Fitness, and Time since onset of injury/illness/exacerbation are also affecting patient's functional outcome.   REHAB POTENTIAL: Good  CLINICAL DECISION MAKING: Stable/uncomplicated  EVALUATION COMPLEXITY: Low   GOALS: Goals reviewed with patient? Yes  SHORT TERM GOALS: Target date: 08/03/23 I HEP  Baseline:initiated at eval Goal status: IN PROGRESS   LONG TERM GOALS: Target date: 09/14/23  Improve L ankle dorsiflexion AROM to 10  degrees or better, and INV, EV to 35 degrees or better for improved walking efficiency Baseline:  Goal status: INITIAL  2.  Strength L ankle plantarflexion 4/5, able to unilaterally perform L heel raise 15x for ability to walk prolonged distances Baseline:  Goal status: INITIAL  3.  FGA score 30/30 Baseline: TBD not tested  initial  eval due to painful gait Goal status: INITIAL  4.  6 min walk test without antalgia L Baseline: antalgic gait consistently Goal status: INITIAL   PLAN:  PT FREQUENCY: 2x/week  PT DURATION: 8 weeks  PLANNED INTERVENTIONS: Therapeutic exercises, Therapeutic activity, Neuromuscular re-education, Balance training, Gait training, Patient/Family education, Self Care, and Joint mobilization  PLAN FOR NEXT SESSION: Progress with more self mobilization instruction, manual techniques to tolerance, possibly kinesiotape for edema, progress therex as needed.   Debra Downs L Debra Downs, PT, DPT, OCS 08/01/2023, 1:11 PM

## 2023-08-03 ENCOUNTER — Other Ambulatory Visit: Payer: Self-pay

## 2023-08-03 ENCOUNTER — Ambulatory Visit: Payer: 59

## 2023-08-03 DIAGNOSIS — R262 Difficulty in walking, not elsewhere classified: Secondary | ICD-10-CM

## 2023-08-03 DIAGNOSIS — S92902A Unspecified fracture of left foot, initial encounter for closed fracture: Secondary | ICD-10-CM

## 2023-08-03 DIAGNOSIS — M25672 Stiffness of left ankle, not elsewhere classified: Secondary | ICD-10-CM | POA: Diagnosis not present

## 2023-08-03 NOTE — Therapy (Signed)
OUTPATIENT PHYSICAL THERAPY TREATMENT   Patient Name: Debra Downs MRN: [id number] DOB:[birth year], 47 y.o., female Today's Date: 08/03/2023  END OF SESSION:  PT End of Session - 08/03/23 0803     Visit Number 5    Date for PT Re-Evaluation 09/14/23    Progress Note Due on Visit 10    PT Start Time 0758    PT Stop Time 0840    PT Time Calculation (min) 42 min    Activity Tolerance Patient tolerated treatment well    Behavior During Therapy WFL for tasks assessed/performed                Past Medical History:  Diagnosis Date   GERD (gastroesophageal reflux disease)    Seborrheic dermatitis    Past Surgical History:  Procedure Laterality Date   LASIK  2007   Patient Active Problem List   Diagnosis Date Noted   Left foot pain 04/24/2013    PCP: Darrow Bussing, MD  REFERRING PROVIDER: Toni Arthurs, MD  REFERRING DIAG: L midfoot stress fracture  THERAPY DIAG:  Difficulty in walking, not elsewhere classified  Fracture, foot, left, closed, initial encounter  Stiffness of left ankle, not elsewhere classified  Rationale for Evaluation and Treatment: Rehabilitation  ONSET DATE: one year, 2023  NEXT MD VISIT: none scheduled with orthopedist   SUBJECTIVE:   SUBJECTIVE STATEMENT: Pt felt good after last visit then stiffened up again after a couple of days.  Has been trying the new ex, not sure performing correctly, also trying to change walking mechanics.   EVAL: Pain L ant ankle foot, L tibialis anterior cramps after prolonged walking, also painful walking, weight bearing on uneven surfaces  PERTINENT HISTORY: H/o L foot swelling , pain for a year.  Saw DPM, treated for tendonitis.  Saw orthopedist had MRI, in hard cast for 6 weeks with scooter, then walking boot for 6 weeks.  In compression sock and regular tennis shoes now for 2 months. Orthopedic MD orders state L ankle ROM, strengthening, gait training, dry needling L tibialis anterior PAIN:  Are  you having pain? No  PRECAUTIONS: None  RED FLAGS: None   WEIGHT BEARING RESTRICTIONS: No  FALLS:  Has patient fallen in last 6 months? 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  PALPATION: Tender over navicular and medial cuneiform  LOWER EXTREMITY ROM:            ROM R   A     L    A/P L  A/P  Ankle dorsiflexion 14 -10/0 8/14  Ankle plantarflexion 78 60/60   Ankle inversion 70 5/5 9/12  Ankle eversion 35 5/5 7/14  L gr toe extension 30 degrees  LOWER EXTREMITY MMT:  ALL WFL unless otherwise noted:   MMT Right eval Left eval  Hip flexion  Hip extension    Hip abduction    Hip adduction    Hip internal rotation    Hip external rotation    Knee flexion    Knee extension    Ankle dorsiflexion  4  Ankle plantarflexion  3-  Ankle inversion  2+  Ankle eversion  2+     LOWER EXTREMITY SPECIAL TESTS:  Ankle special tests: Talar tilt test: positive   FUNCTIONAL TESTS:  6 minute walk test: TBD Functional gait assessment: TBD  GAIT: Distance walked: 97' in clinic Assistive device utilized: None Level of assistance: Modified independence Comments: decreased stance time on L, flat foot contact L, hyperextends L knee, avoids roll off L distal toes, decreased stride length R by 1/2 distance   TODAY'S  TREATMENT:                                                                                                                              DATE:  08/03/23: Manual:  mobilization with movement , combined with lunges, L subtalar jt post/lat glides 3 bouts 15 reps to improve dorsiflexion motion and mechanics Various rearfoot jt mobs, including navicular whip, lat and med calcaneal glides, to improve INV/ EV movement Distraction of calcaneus with plantarflexion and inversion stretch  Therex:  Utilized hard cardboard roller, inst pt to roll along L arch to improve stiffness mid foot.  Also reviewed and practiced B heel raises, with inversion component, with theraband around lower legs with isometric abduction B lower legs , cues to maintain hips forward to achieve plantarflexion stretch  08/01/23:   Manual:   Gr 2/3 jt mobs, initially mobilization with movement for L ankle subtalar jt with lunges with L LE on foot stool, 3 sets 15 reps Inferior/superior jt mobs, mid foot, emphasis on lateral foot, combined with inversion motion.   Distraction of L calcaneus combined with plantarflexion stretch Distraction of mid foot and tarsals  Therex: modified home program to emphasize stretching of L ankle into dorsiflexion and avoiding medial foot/ ankle overpronation pressure.  Utilized 2" book, for B dorsiflexion stretch, with theraband around ankles , for isometric push into inversion B while performing  B foot drop. Performed to fatigue, cues frequently to keep hips forward to isolate stretch B plantarflexors. 07/27/23 THERAPEUTIC EXERCISE: to improve flexibility, strength and mobility.  Demonstration, verbal and tactile cues throughout for technique.  Rec Bike - L2 x 6 min Kneeling L subtalar mobs with towel + DF stretch 3 x 30 sec Long-sitting L ankle CW/CCW circles 2 x 10 each direction Long-sitting L ankle D1/2 PNF diagonals 2 x 10 each direction L SLS 3 x 10-15 sec, 1 pole A for balance L SLS + 5-way  star/clock taps to colored dots x 10, 1 pole A for balance Standing heel/toe raises x 20 - increased shift to R LE noted L ankle inversion and eversion isometrics into ball 10 x 5" each, 2 sets  MANUAL THERAPY: To promote  improved flexibility, improved joint mobility, increased ROM, reduced pain, and reduced edema. Gentle distraction of subtalar jt L foot Distraction tarsals Grade II-III A/P subtalar joint mobs L tarsal/metatarsal mobs Retrograde massage for edema reduction  MODALITIES: Game Ready vasopneumatic compression post session to L ankle x 10 min, high compression, 34 to reduce post-exercise pain and swelling/edema   07/25/23 THERAPEUTIC EXERCISE: to improve flexibility, strength and mobility.  Demonstration, verbal and tactile cues throughout for technique.  Rec Bike - L1 x 6 min L ankle black TB PF & DF 2 x 10 each L ankle inversion and eversion isometrics into ball 10 x 5" each L PF stretch forefoot on 1" book to block rotation/ abduction of rear foot 3 x 30" L DF mob with black TB at ankle in L PF stretch with & w/o 1" book 2 x 30" each  MANUAL THERAPY: To promote improved flexibility, improved joint mobility, increased ROM, reduced pain, and reduced edema. Gentle distraction of subtalar jt L foot Distraction tarsals Grade II-III A/P subtalar joint mobs L metatarsal mobs Retrograde massage for edema reduction  STM to L ant tib, peroneals, and gastroc/soleus  MODALITIES: Game Ready vasopneumatic compression post session to L foot and ankle x 10 min, medium compression, 34 to reduce post-exercise pain and swelling/edema   07/20/23:  Evaluation, gentle distraction of subtalar jt, distraction tarsals. Instructed in therex as described below to improve L ankle ROM and strength, with black t band Stretch L plantarflexors with forefoot on 1" book to block rotation/ abduction of rear foot    PATIENT EDUCATION:  Education details: HEP review and HEP modification - ankle  IV/EV switch to isometrics   Person educated: Patient Education method: Explanation, Demonstration, Tactile cues, Verbal cues, and Handouts Education comprehension: verbalized understanding, returned demonstration, verbal cues required, tactile cues required, and needs further education  HOME EXERCISE PROGRAM: Access Code: [email or web address] URL: [email or web address] Date: 07/25/2023 Prepared by: Glenetta Hew  Exercises - Gastroc Stretch on Step  - 2-3 x daily - 7 x weekly - 1 sets - 3 reps - 30 sec hold - Long Sitting Ankle Plantar Flexion with Resistance  - 1 x daily - 7 x weekly - 3 sets - 10 reps - 3 sec hold - Seated Ankle Dorsiflexion with Resistance  - 1 x daily - 7 x weekly - 2 sets - 10 reps - 3 sec hold - Isometric Ankle Inversion  - 1 x daily - 7 x weekly - 2 sets - 10 reps - 3-5 sec hold - Isometric Ankle Eversion at Wall  - 1 x daily - 7 x weekly - 2 sets - 10 reps - 3-5 sec hold  ASSESSMENT:  CLINICAL IMPRESSION: Debra Downs"  is attending skilled PT to address L foot/ ankle pain. Downs DF/ PF motion is improved, L foot inversion /eversion still quite restricted, with edema medial L ankle, and hard end feel with overpressure into L foot inversion, continued to address with manual techniques today and progressed therex appropriately. Improved gait pattern again today at end of session. Debra Downs benefit from continued skilled PT to address above deficits to improve mobility and activity tolerance with decreased pain interference. She is going to reduce frequency of PT to 1 x week, Downs continue to address mobility of L medial ankle, subtalar jt.  May need weight bearing therex on wedge.   OBJECTIVE IMPAIRMENTS: decreased activity tolerance, decreased balance, decreased mobility, difficulty walking, decreased ROM, decreased strength, hypomobility, increased edema, increased fascial restrictions, impaired flexibility,  and pain.   ACTIVITY LIMITATIONS: carrying,  squatting, stairs, and locomotion level  PARTICIPATION LIMITATIONS: cleaning, laundry, shopping, community activity, and yard work  PERSONAL FACTORS: Age, Behavior pattern, Education, Fitness, and Time since onset of injury/illness/exacerbation are also affecting patient's functional outcome.   REHAB POTENTIAL: Good  CLINICAL DECISION MAKING: Stable/uncomplicated  EVALUATION COMPLEXITY: Low   GOALS: Goals reviewed with patient? Yes  SHORT TERM GOALS: Target date: 08/03/23 I HEP  Baseline:initiated at eval Goal status: IN PROGRESS   LONG TERM GOALS: Target date: 09/14/23  Improve L ankle dorsiflexion AROM to 10  degrees or better, and INV, EV to 35 degrees or better for improved walking efficiency Baseline:  Goal status: INITIAL  2.  Strength L ankle plantarflexion 4/5, able to unilaterally perform L heel raise 15x for ability to walk prolonged distances Baseline:  Goal status: INITIAL  3.  FGA score 30/30 Baseline: TBD not tested initial eval due to painful gait Goal status: INITIAL  4.  6 min walk test without antalgia L Baseline: antalgic gait consistently Goal status: INITIAL   PLAN:  PT FREQUENCY: 2x/week  PT DURATION: 8 weeks  PLANNED INTERVENTIONS: Therapeutic exercises, Therapeutic activity, Neuromuscular re-education, Balance training, Gait training, Patient/Family education, Self Care, and Joint mobilization  PLAN FOR NEXT SESSION: Progress with more self mobilization instruction, manual techniques to tolerance, progress therex as needed.   Jaques Mineer L Page Lancon, PT, DPT, OCS 08/03/2023, 3:21 PM

## 2023-08-10 ENCOUNTER — Ambulatory Visit: Payer: 59

## 2023-08-10 DIAGNOSIS — S92902A Unspecified fracture of left foot, initial encounter for closed fracture: Secondary | ICD-10-CM

## 2023-08-10 DIAGNOSIS — R262 Difficulty in walking, not elsewhere classified: Secondary | ICD-10-CM

## 2023-08-10 DIAGNOSIS — M25672 Stiffness of left ankle, not elsewhere classified: Secondary | ICD-10-CM | POA: Diagnosis not present

## 2023-08-10 NOTE — Therapy (Signed)
OUTPATIENT PHYSICAL THERAPY TREATMENT   Patient Name: Debra Downs MRN: [id number] DOB:01-Jul-1976, 47 y.o., female Today's Date: 08/10/2023  END OF SESSION:  PT End of Session - 08/10/23 0942     Visit Number 6    Date for PT Re-Evaluation 09/14/23    Authorization Type [facility] [employer]    Progress Note Due on Visit 10    PT Start Time 0930    PT Stop Time 1015    PT Time Calculation (min) 45 min                Past Medical History:  Diagnosis Date   GERD (gastroesophageal reflux disease)    Seborrheic dermatitis    Past Surgical History:  Procedure Laterality Date   LASIK  2007   Patient Active Problem List   Diagnosis Date Noted   Left foot pain 04/24/2013    PCP: Darrow Bussing, MD  REFERRING PROVIDER: Toni Arthurs, MD  REFERRING DIAG: L midfoot stress fracture  THERAPY DIAG:  Difficulty in walking, not elsewhere classified  Fracture, foot, left, closed, initial encounter  Stiffness of left ankle, not elsewhere classified  Rationale for Evaluation and Treatment: Rehabilitation  ONSET DATE: one year, 2023  NEXT MD VISIT: none scheduled with orthopedist   SUBJECTIVE:   SUBJECTIVE STATEMENT: Pt overall continuing to slowly imrove. Has tried some bouts without her rigid orthotics.  Still edematous L medial ankle.    EVAL: Pain L ant ankle foot, L tibialis anterior cramps after prolonged walking, also painful walking, weight bearing on uneven surfaces  PERTINENT HISTORY: H/o L foot swelling , pain for a year.  Saw DPM, treated for tendonitis.  Saw orthopedist had MRI, in hard cast for 6 weeks with scooter, then walking boot for 6 weeks.  In compression sock and regular tennis shoes now for 2 months. Orthopedic MD orders state L ankle ROM, strengthening, gait training, dry needling L tibialis anterior PAIN:  Are you having pain? No  PRECAUTIONS: None  RED FLAGS: None   WEIGHT BEARING RESTRICTIONS: No  FALLS:  Has patient fallen in last 6  months? 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  PALPATION: Tender over navicular and medial cuneiform  LOWER EXTREMITY ROM:            ROM R   A     L    A/P L  A/P  Ankle dorsiflexion 14 -10/0 8/14  Ankle plantarflexion 78 60/60   Ankle inversion 70 5/5 9/12  Ankle eversion 35 5/5 7/14  L gr toe extension 30 degrees  LOWER EXTREMITY MMT:  ALL WFL unless otherwise noted:   MMT Right eval Left eval  Hip flexion    Hip extension    Hip abduction    Hip adduction    Hip internal rotation    Hip external  rotation    Knee flexion    Knee extension    Ankle dorsiflexion  4  Ankle plantarflexion  3-  Ankle inversion  2+  Ankle eversion  2+     LOWER EXTREMITY SPECIAL TESTS:  Ankle special tests: Talar tilt test: positive   FUNCTIONAL TESTS:  6 minute walk test: TBD Functional gait assessment: TBD  GAIT: Distance walked: 69' in clinic Assistive device utilized: None Level of assistance: Modified independence Comments: decreased stance time on L, flat foot contact L, hyperextends L knee, avoids roll off L distal toes, decreased stride length R by 1/2 distance   TODAY'S TREATMENT:                                                                                                                              DATE:  08/10/23 Manual:  long sitting for traction subtalar jt, combined with overpressure and medial glides navicular, to address inversion movement .  prone HVLA whip navicular, also calcaneal and subtalar jt distraction  Therex:  Initially walked 700' to improve tissue perfusion, jt mobility Seated then standing with L foot on 1/2 foam roller, combined with PF/ DF, emphasis on isolating inversion motion L ankle  Instructed in new ex for home, with folded yoga mat under medial forefoot, heel on floor, with unilateral heel raises, to fatigue, approximately 30 reps.  To perform 2 - 3 x day to fatigue but advised to perform without pain. 08/03/23: Manual:  mobilization with movement , combined with lunges, L subtalar jt post/lat glides 3 bouts 15 reps to improve dorsiflexion motion and mechanics Various rearfoot jt mobs, including navicular whip, lat and med calcaneal glides, to improve INV/ EV movement Distraction of calcaneus with plantarflexion and inversion stretch  Therex:  Utilized hard cardboard roller, inst pt to roll along L arch to improve stiffness mid foot.  Also reviewed and practiced B heel raises, with inversion component, with theraband around lower legs with isometric abduction B lower legs , cues to maintain hips forward to achieve plantarflexion stretch  08/01/23:   Manual:   Gr 2/3 jt mobs, initially mobilization with movement for L ankle subtalar jt with lunges with L LE on foot stool, 3 sets 15 reps Inferior/superior jt mobs, mid foot, emphasis on lateral foot, combined with inversion motion.   Distraction of L calcaneus combined with plantarflexion stretch Distraction of mid foot and tarsals  Therex: modified home program to emphasize stretching of L ankle into dorsiflexion and avoiding medial foot/ ankle overpronation pressure.  Utilized 2" book, for B dorsiflexion stretch, with theraband around ankles , for isometric push into inversion B while performing  B foot drop.  Performed to fatigue, cues frequently to keep hips forward to isolate stretch B plantarflexors. 07/27/23 THERAPEUTIC EXERCISE: to improve flexibility, strength and mobility.  Demonstration, verbal and tactile cues throughout for technique.  Rec Bike - L2 x 6 min Kneeling L subtalar mobs with towel + DF stretch  3 x 30 sec Long-sitting L ankle CW/CCW circles 2 x 10 each direction Long-sitting L ankle D1/2 PNF diagonals 2 x 10 each direction L SLS 3 x 10-15 sec, 1 pole A for balance L SLS + 5-way star/clock taps to colored dots x 10, 1 pole A for balance Standing heel/toe raises x 20 - increased shift to R LE noted L ankle inversion and eversion isometrics into ball 10 x 5" each, 2 sets  MANUAL THERAPY: To promote improved flexibility, improved joint mobility, increased ROM, reduced pain, and reduced edema. Gentle distraction of subtalar jt L foot Distraction tarsals Grade II-III A/P subtalar joint mobs L tarsal/metatarsal mobs Retrograde massage for edema reduction  MODALITIES: Game Ready vasopneumatic compression post session to L ankle x 10 min, high compression, 34 to reduce post-exercise pain and swelling/edema   07/25/23 THERAPEUTIC EXERCISE: to improve flexibility, strength and mobility.  Demonstration, verbal and tactile cues throughout for technique.  Rec Bike - L1 x 6 min L ankle black TB PF & DF 2 x 10 each L ankle inversion and eversion isometrics into ball 10 x 5" each L PF stretch forefoot on 1" book to block rotation/ abduction of rear foot 3 x 30" L DF mob with black TB at ankle in L PF stretch with & w/o 1" book 2 x 30" each  MANUAL THERAPY: To promote improved flexibility, improved joint mobility, increased ROM, reduced pain, and reduced edema. Gentle distraction of subtalar jt L foot Distraction tarsals Grade II-III A/P subtalar joint mobs L metatarsal mobs Retrograde massage for edema reduction  STM to L ant tib, peroneals, and gastroc/soleus  MODALITIES: Game  Ready vasopneumatic compression post session to L foot and ankle x 10 min, medium compression, 34 to reduce post-exercise pain and swelling/edema   07/20/23:  Evaluation, gentle distraction of subtalar jt, distraction tarsals. Instructed in therex as described below to improve L ankle ROM and strength, with black t band Stretch L plantarflexors with forefoot on 1" book to block rotation/ abduction of rear foot    PATIENT EDUCATION:  Education details: HEP review and HEP modification - ankle IV/EV switch to isometrics   Person educated: Patient Education method: Explanation, Demonstration, Tactile cues, Verbal cues, and Handouts Education comprehension: verbalized understanding, returned demonstration, verbal cues required, tactile cues required, and needs further education  HOME EXERCISE PROGRAM: Access Code: [email or web address] URL: [email or web address] Date: 07/25/2023 Prepared by: Glenetta Hew  Exercises - Gastroc Stretch on Step  - 2-3 x daily - 7 x weekly - 1 sets - 3 reps - 30 sec hold - Long Sitting Ankle Plantar Flexion with Resistance  - 1 x daily - 7 x weekly - 3 sets - 10 reps - 3 sec hold - Seated Ankle Dorsiflexion with Resistance  - 1 x daily - 7 x weekly - 2 sets - 10 reps - 3 sec hold - Isometric Ankle Inversion  - 1 x daily - 7 x weekly - 2 sets - 10 reps - 3-5 sec hold - Isometric Ankle Eversion at Wall  - 1 x daily - 7 x weekly - 2 sets - 10 reps - 3-5 sec hold  ASSESSMENT:  CLINICAL IMPRESSION: De Libman"  is attending skilled PT to address L foot/ ankle pain. Her DF/ PF motion is improved, L foot inversion /eversion still quite restricted, with edema medial L ankle.  She is consistently walking without the eversion of L ankle that was present initially, still limited toe off L , shortens R stride  length. Responding well to manual techniques again. . She is going to reduce frequency of PT to 1 x week, will continue to address mobility of L medial ankle,  subtalar jt.     OBJECTIVE IMPAIRMENTS: decreased activity tolerance, decreased balance, decreased mobility, difficulty walking, decreased ROM, decreased strength, hypomobility, increased edema, increased fascial restrictions, impaired flexibility, and pain.   ACTIVITY LIMITATIONS: carrying, squatting, stairs, and locomotion level  PARTICIPATION LIMITATIONS: cleaning, laundry, shopping, community activity, and yard work  PERSONAL FACTORS: Age, Behavior pattern, Education, Fitness, and Time since onset of injury/illness/exacerbation are also affecting patient's functional outcome.   REHAB POTENTIAL: Good  CLINICAL DECISION MAKING: Stable/uncomplicated  EVALUATION COMPLEXITY: Low   GOALS: Goals reviewed with patient? Yes  SHORT TERM GOALS: Target date: 08/03/23 I HEP  Baseline:initiated at eval Goal status: IN PROGRESS   LONG TERM GOALS: Target date: 09/14/23  Improve L ankle dorsiflexion AROM to 10  degrees or better, and INV, EV to 35 degrees or better for improved walking efficiency Baseline:  Goal status: INITIAL  2.  Strength L ankle plantarflexion 4/5, able to unilaterally perform L heel raise 15x for ability to walk prolonged distances Baseline:  Goal status: INITIAL  3.  FGA score 30/30 Baseline: TBD not tested initial eval due to painful gait Goal status: INITIAL  4.  6 min walk test without antalgia L Baseline: antalgic gait consistently Goal status: INITIAL   PLAN:  PT FREQUENCY: 2x/week  PT DURATION: 8 weeks  PLANNED INTERVENTIONS: Therapeutic exercises, Therapeutic activity, Neuromuscular re-education, Balance training, Gait training, Patient/Family education, Self Care, and Joint mobilization  PLAN FOR NEXT SESSION: Progress with more self mobilization instruction, manual techniques to tolerance, progress therex as needed.   Jakeya Gherardi L Taite Schoeppner, PT, DPT, OCS 08/10/2023, 5:05 PM

## 2023-08-17 ENCOUNTER — Other Ambulatory Visit: Payer: Self-pay

## 2023-08-17 ENCOUNTER — Ambulatory Visit: Payer: 59

## 2023-08-17 DIAGNOSIS — R262 Difficulty in walking, not elsewhere classified: Secondary | ICD-10-CM | POA: Diagnosis not present

## 2023-08-17 DIAGNOSIS — M25672 Stiffness of left ankle, not elsewhere classified: Secondary | ICD-10-CM | POA: Diagnosis not present

## 2023-08-17 DIAGNOSIS — S92902A Unspecified fracture of left foot, initial encounter for closed fracture: Secondary | ICD-10-CM

## 2023-08-17 NOTE — Therapy (Signed)
OUTPATIENT PHYSICAL THERAPY TREATMENT   Patient Name: MAKENLEE ASCHEMAN MRN: [id number] DOB:02/07/1976, 47 y.o., female Today's Date: 08/17/2023  END OF SESSION:  PT End of Session - 08/17/23 0836     Visit Number 7    Date for PT Re-Evaluation 09/14/23    Authorization Type [facility] [employer]    Progress Note Due on Visit 10    PT Start Time 0758    PT Stop Time 0845    PT Time Calculation (min) 47 min    Activity Tolerance Patient tolerated treatment well    Behavior During Therapy WFL for tasks assessed/performed                Past Medical History:  Diagnosis Date   GERD (gastroesophageal reflux disease)    Seborrheic dermatitis    Past Surgical History:  Procedure Laterality Date   LASIK  2007   Patient Active Problem List   Diagnosis Date Noted   Left foot pain 04/24/2013    PCP: Darrow Bussing, MD  REFERRING PROVIDER: Toni Arthurs, MD  REFERRING DIAG: L midfoot stress fracture  THERAPY DIAG:  Difficulty in walking, not elsewhere classified  Fracture, foot, left, closed, initial encounter  Stiffness of left ankle, not elsewhere classified  Rationale for Evaluation and Treatment: Rehabilitation  ONSET DATE: one year, 2023  NEXT MD VISIT: none scheduled with orthopedist   SUBJECTIVE:   SUBJECTIVE STATEMENT: Pt overall continuing to slowly imrove. No pain today. Noticed with the new ex from last week she started having some lateral foot pain with weight bearing in terminal stance but has gotten better now. The tape seemed to help, also  Has tried some bouts without her rigid orthotics.  Still edematous L medial ankle.    EVAL: Pain L ant ankle foot, L tibialis anterior cramps after prolonged walking, also painful walking, weight bearing on uneven surfaces  PERTINENT HISTORY: H/o L foot swelling , pain for a year.  Saw DPM, treated for tendonitis.  Saw orthopedist had MRI, in hard cast for 6 weeks with scooter, then walking boot for 6 weeks.  In  compression sock and regular tennis shoes now for 2 months. Orthopedic MD orders state L ankle ROM, strengthening, gait training, dry needling L tibialis anterior PAIN:  Are you having pain? No  PRECAUTIONS: None  RED FLAGS: None   WEIGHT BEARING RESTRICTIONS: No  FALLS:  Has patient fallen in last 6 months? 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  PALPATION: Tender over navicular and medial cuneiform  LOWER EXTREMITY ROM:            ROM R   A     L    A/P L  A/P  Ankle dorsiflexion 14 -10/0 8/14  Ankle plantarflexion 78 60/60   Ankle inversion 70 5/5 9/12  Ankle eversion 35  5/5 7/14  L gr toe extension 30 degrees  LOWER EXTREMITY MMT:  ALL WFL unless otherwise noted:   MMT Right eval Left eval  Hip flexion    Hip extension    Hip abduction    Hip adduction    Hip internal rotation    Hip external rotation    Knee flexion    Knee extension    Ankle dorsiflexion  4  Ankle plantarflexion  3-  Ankle inversion  2+  Ankle eversion  2+     LOWER EXTREMITY SPECIAL TESTS:  Ankle special tests: Talar tilt test: positive   FUNCTIONAL TESTS:  6 minute walk test: TBD Functional gait assessment: TBD  GAIT: Distance walked: 10' in clinic Assistive device utilized: None Level of assistance: Modified  independence Comments: decreased stance time on L, flat foot contact L, hyperextends L knee, avoids roll off L distal toes, decreased stride length R by 1/2 distance   TODAY'S TREATMENT:                                                                                                                              DATE:  08/17/23:  Manual:  all manual techniques today to improve mid foot and subtalar jt mobility as follows: Long sitting for A/P navicular mobs, stabilizing forefoot with cardboard roll under foot,5 bouts, 30 to 45 sec holds/oscillations Side lying on L for subtalar jt lateral glides, 5 bouts, 30 to 45 sec holds oscillations. Lunges forward, mobilization with movement L subtalar jt, 2 bouts of 15 reps.    Gait following interventions, patient still tends to shorten her stride length R, reports L lateral foot pain today when attempting to elongate R step length., therefore utilized kinesiotaping to support plantar fascia L , with 2- I pieces, with attachments tarsals mid and lateral, extending to med and lat gastroc proximally.  08/10/23 Manual:  long sitting for traction subtalar jt, combined with overpressure and medial glides navicular, to address inversion movement .  prone HVLA whip navicular, also calcaneal and subtalar jt distraction  Therex:  Initially walked 700' to improve tissue perfusion, jt mobility Seated then standing with L foot on 1/2 foam roller, combined with PF/ DF, emphasis on isolating inversion motion L ankle  Instructed in new ex for home, with folded yoga mat under medial forefoot, heel on floor, with unilateral heel raises, to fatigue, approximately 30 reps.  To perform 2 - 3 x day to fatigue but advised to perform without pain. 08/03/23: Manual:  mobilization with movement , combined with lunges, L subtalar jt post/lat glides 3 bouts 15 reps to improve dorsiflexion motion and mechanics Various rearfoot jt mobs, including navicular whip, lat and med  calcaneal glides, to improve INV/ EV movement Distraction of calcaneus with plantarflexion and inversion stretch  Therex:  Utilized hard cardboard roller, inst pt to roll along L arch to improve stiffness mid foot.  Also reviewed and practiced B heel raises, with  inversion component, with theraband around lower legs with isometric abduction B lower legs , cues to maintain hips forward to achieve plantarflexion stretch  08/01/23:   Manual:   Gr 2/3 jt mobs, initially mobilization with movement for L ankle subtalar jt with lunges with L LE on foot stool, 3 sets 15 reps Inferior/superior jt mobs, mid foot, emphasis on lateral foot, combined with inversion motion.   Distraction of L calcaneus combined with plantarflexion stretch Distraction of mid foot and tarsals  Therex: modified home program to emphasize stretching of L ankle into dorsiflexion and avoiding medial foot/ ankle overpronation pressure.  Utilized 2" book, for B dorsiflexion stretch, with theraband around ankles , for isometric push into inversion B while performing  B foot drop. Performed to fatigue, cues frequently to keep hips forward to isolate stretch B plantarflexors. 07/27/23 THERAPEUTIC EXERCISE: to improve flexibility, strength and mobility.  Demonstration, verbal and tactile cues throughout for technique.  Rec Bike - L2 x 6 min Kneeling L subtalar mobs with towel + DF stretch 3 x 30 sec Long-sitting L ankle CW/CCW circles 2 x 10 each direction Long-sitting L ankle D1/2 PNF diagonals 2 x 10 each direction L SLS 3 x 10-15 sec, 1 pole A for balance L SLS + 5-way star/clock taps to colored dots x 10, 1 pole A for balance Standing heel/toe raises x 20 - increased shift to R LE noted L ankle inversion and eversion isometrics into ball 10 x 5" each, 2 sets  MANUAL THERAPY: To promote improved flexibility, improved joint mobility, increased ROM, reduced pain, and reduced edema. Gentle distraction of subtalar jt L  foot Distraction tarsals Grade II-III A/P subtalar joint mobs L tarsal/metatarsal mobs Retrograde massage for edema reduction  MODALITIES: Game Ready vasopneumatic compression post session to L ankle x 10 min, high compression, 34 to reduce post-exercise pain and swelling/edema   07/25/23 THERAPEUTIC EXERCISE: to improve flexibility, strength and mobility.  Demonstration, verbal and tactile cues throughout for technique.  Rec Bike - L1 x 6 min L ankle black TB PF & DF 2 x 10 each L ankle inversion and eversion isometrics into ball 10 x 5" each L PF stretch forefoot on 1" book to block rotation/ abduction of rear foot 3 x 30" L DF mob with black TB at ankle in L PF stretch with & w/o 1" book 2 x 30" each  MANUAL THERAPY: To promote improved flexibility, improved joint mobility, increased ROM, reduced pain, and reduced edema. Gentle distraction of subtalar jt L foot Distraction tarsals Grade II-III A/P subtalar joint mobs L metatarsal mobs Retrograde massage for edema reduction  STM to L ant tib, peroneals, and gastroc/soleus  MODALITIES: Game Ready vasopneumatic compression post session to L foot and ankle x 10 min, medium compression, 34 to reduce post-exercise pain and swelling/edema   07/20/23:  Evaluation, gentle distraction of subtalar jt, distraction tarsals. Instructed in therex as described below to improve L ankle ROM and strength, with black t band Stretch L plantarflexors with forefoot on 1" book to block rotation/ abduction of rear foot    PATIENT EDUCATION:  Education details: HEP review and HEP modification - ankle IV/EV switch to isometrics   Person educated: Patient Education method: Explanation, Demonstration, Tactile cues, Verbal cues, and Handouts Education comprehension: verbalized understanding, returned demonstration, verbal cues required, tactile cues required, and needs further education  HOME EXERCISE PROGRAM: Access Code: [email or web address] URL:  [email or web address] Date: 07/25/2023 Prepared by: Glenetta Hew  Exercises - Gastroc Stretch on Step  -  2-3 x daily - 7 x weekly - 1 sets - 3 reps - 30 sec hold - Long Sitting Ankle Plantar Flexion with Resistance  - 1 x daily - 7 x weekly - 3 sets - 10 reps - 3 sec hold - Seated Ankle Dorsiflexion with Resistance  - 1 x daily - 7 x weekly - 2 sets - 10 reps - 3 sec hold - Isometric Ankle Inversion  - 1 x daily - 7 x weekly - 2 sets - 10 reps - 3-5 sec hold - Isometric Ankle Eversion at Wall  - 1 x daily - 7 x weekly - 2 sets - 10 reps - 3-5 sec hold  ASSESSMENT:  CLINICAL IMPRESSION: Khaila Shimabukuro"  is attending skilled PT to address L foot/ ankle pain. Her DF/ PF motion continues to be improved, L foot inversion /eversion some improvement noted today, edema reducing.gait with limited toe off L , shortens R stride length. Responding well to manual techniques again. . She is going to continue with skilled PT 1 x week, will continue to address mobility of L medial ankle, subtalar jt.     OBJECTIVE IMPAIRMENTS: decreased activity tolerance, decreased balance, decreased mobility, difficulty walking, decreased ROM, decreased strength, hypomobility, increased edema, increased fascial restrictions, impaired flexibility, and pain.   ACTIVITY LIMITATIONS: carrying, squatting, stairs, and locomotion level  PARTICIPATION LIMITATIONS: cleaning, laundry, shopping, community activity, and yard work  PERSONAL FACTORS: Age, Behavior pattern, Education, Fitness, and Time since onset of injury/illness/exacerbation are also affecting patient's functional outcome.   REHAB POTENTIAL: Good  CLINICAL DECISION MAKING: Stable/uncomplicated  EVALUATION COMPLEXITY: Low   GOALS: Goals reviewed with patient? Yes  SHORT TERM GOALS: Target date: 08/03/23 I HEP  Baseline:initiated at eval Goal status: IN PROGRESS   LONG TERM GOALS: Target date: 09/14/23  Improve L ankle dorsiflexion AROM  to 10  degrees or better, and INV, EV to 35 degrees or better for improved walking efficiency Baseline:  Goal status: INITIAL  2.  Strength L ankle plantarflexion 4/5, able to unilaterally perform L heel raise 15x for ability to walk prolonged distances Baseline:  Goal status: INITIAL  3.  FGA score 30/30 Baseline: TBD not tested initial eval due to painful gait Goal status: INITIAL  4.  6 min walk test without antalgia L Baseline: antalgic gait consistently Goal status: INITIAL   PLAN:  PT FREQUENCY: 2x/week  PT DURATION: 8 weeks  PLANNED INTERVENTIONS: Therapeutic exercises, Therapeutic activity, Neuromuscular re-education, Balance training, Gait training, Patient/Family education, Self Care, and Joint mobilization  PLAN FOR NEXT SESSION: Progress with more self mobilization instruction, manual techniques to tolerance, progress therex as needed.   Fronia Depass L Dom Haverland, PT, DPT, OCS 08/17/2023, 10:51 AM

## 2023-08-22 ENCOUNTER — Ambulatory Visit: Payer: 59

## 2023-08-22 ENCOUNTER — Other Ambulatory Visit: Payer: Self-pay

## 2023-08-22 DIAGNOSIS — M25672 Stiffness of left ankle, not elsewhere classified: Secondary | ICD-10-CM | POA: Diagnosis not present

## 2023-08-22 DIAGNOSIS — S92902A Unspecified fracture of left foot, initial encounter for closed fracture: Secondary | ICD-10-CM | POA: Diagnosis not present

## 2023-08-22 DIAGNOSIS — R262 Difficulty in walking, not elsewhere classified: Secondary | ICD-10-CM | POA: Diagnosis not present

## 2023-08-22 NOTE — Therapy (Signed)
OUTPATIENT PHYSICAL THERAPY TREATMENT   Patient Name: Debra Downs MRN: [id number] DOB:September 06, 1976, 47 y.o., female Today's Date: 08/22/2023  END OF SESSION:  PT End of Session - 08/22/23 1643     Visit Number 8    Date for PT Re-Evaluation 09/14/23    Authorization Type [facility] [employer]    Progress Note Due on Visit 10    PT Start Time 1400    PT Stop Time 1452    PT Time Calculation (min) 52 min    Activity Tolerance Patient tolerated treatment well    Behavior During Therapy WFL for tasks assessed/performed                 Past Medical History:  Diagnosis Date   GERD (gastroesophageal reflux disease)    Seborrheic dermatitis    Past Surgical History:  Procedure Laterality Date   LASIK  2007   Patient Active Problem List   Diagnosis Date Noted   Left foot pain 04/24/2013    PCP: Darrow Bussing, MD  REFERRING PROVIDER: Toni Arthurs, MD  REFERRING DIAG: L midfoot stress fracture  THERAPY DIAG:  Difficulty in walking, not elsewhere classified  Fracture, foot, left, closed, initial encounter  Stiffness of left ankle, not elsewhere classified  Rationale for Evaluation and Treatment: Rehabilitation  ONSET DATE: one year, 2023  NEXT MD VISIT: none scheduled with orthopedist   SUBJECTIVE:   SUBJECTIVE STATEMENT: Pt felt good after last session which was 5 days ago.  Attended PT that morning, then walked 2 mi with a friend on [address] that afternoon.  Started experiencing pain and edema after 1 mi, still painful today and swollen.   EVAL: Pain L ant ankle foot, L tibialis anterior cramps after prolonged walking, also painful walking, weight bearing on uneven surfaces  PERTINENT HISTORY: H/o L foot swelling , pain for a year.  Saw DPM, treated for tendonitis.  Saw orthopedist had MRI, in hard cast for 6 weeks with scooter, then walking boot for 6 weeks.  In compression sock and regular tennis shoes now for 2 months. Orthopedic MD orders state L ankle  ROM, strengthening, gait training, dry needling L tibialis anterior PAIN:  Are you having pain? No  PRECAUTIONS: None  RED FLAGS: None   WEIGHT BEARING RESTRICTIONS: No  FALLS:  Has patient fallen in last 6 months? 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  PALPATION: Tender over navicular and medial cuneiform  LOWER EXTREMITY ROM:            ROM R   A     L    A/P L  A/P  Ankle dorsiflexion 14 -10/0 8/14  Ankle plantarflexion 78 60/60   Ankle inversion 70 5/5 9/12  Ankle eversion 35 5/5 7/14  L gr toe extension 30 degrees  LOWER EXTREMITY MMT:  ALL  WFL unless otherwise noted:   MMT Right eval Left eval  Hip flexion    Hip extension    Hip abduction    Hip adduction    Hip internal rotation    Hip external rotation    Knee flexion    Knee extension    Ankle dorsiflexion  4  Ankle plantarflexion  3-  Ankle inversion  2+  Ankle eversion  2+     LOWER EXTREMITY SPECIAL TESTS:  Ankle special tests: Talar tilt test: positive   FUNCTIONAL TESTS:  6 minute walk test: TBD Functional gait assessment: TBD  GAIT: Distance walked: 79' in clinic Assistive device utilized: None Level of assistance: Modified independence Comments: decreased stance time on L, flat foot contact L, hyperextends L knee, avoids roll  off L distal toes, decreased stride length R by 1/2 distance   TODAY'S TREATMENT:                                                                                                                              DATE:  08/22/23: Manual:  assessed L ankle movement, dorsiflexion 11 degrees, plantarflexion quite restricted today , 2/3 of ROM as compared to R foot.  Utilized gentle distraction of L mid foot without much relief or movement, then utilized AP glides subtalar jt, in supine while assisting with dorsiflexion stretch , which provided some relief, so performed mob with movement with L foot on step, subtalar post  glides.  Relief noted by pt with improved movement after this technique, performed 3 bouts 10 reps each Also pain with L foot inversion and plantarflexion, relief with distraction and superior glide of navicular bone .   Demonstrated to pt self mob with theraband across subtalar jt with L foot on chair with lunging , also demonstrated navicular distraction and superior glide in tailor sitting position.  Also reassessed hip ROM, which was full and symmetrical B L iliac crest elevated,, added 1/8" cork R shoe to adapt for limb length asymmetry and provide for a more symmetrical gait pattern.  08/17/23:  Manual:  all manual techniques today to improve mid foot and subtalar jt mobility as follows: Long sitting for A/P navicular mobs, stabilizing forefoot with cardboard roll under foot,5 bouts, 30 to 45 sec holds/oscillations Side lying on L for subtalar jt lateral glides, 5 bouts, 30 to 45 sec holds oscillations. Lunges forward, mobilization with movement L subtalar jt, 2 bouts of 15 reps.    Gait following interventions, patient still tends to shorten her stride length R, reports L lateral foot pain today when attempting to elongate R step length., therefore utilized kinesiotaping to support plantar fascia L , with 2- I pieces, with attachments tarsals mid and lateral, extending to med and  lat gastroc proximally.  08/10/23 Manual:  long sitting for traction subtalar jt, combined with overpressure and medial glides navicular, to address inversion movement .  prone HVLA whip navicular, also calcaneal and subtalar jt  distraction  Therex:  Initially walked 700' to improve tissue perfusion, jt mobility Seated then standing with L foot on 1/2 foam roller, combined with PF/ DF, emphasis on isolating inversion motion L ankle  Instructed in new ex for home, with folded yoga mat under medial forefoot, heel on floor, with unilateral heel raises, to fatigue, approximately 30 reps.  To perform 2 - 3 x day to fatigue but advised to perform without pain. 08/03/23: Manual:  mobilization with movement , combined with lunges, L subtalar jt post/lat glides 3 bouts 15 reps to improve dorsiflexion motion and mechanics Various rearfoot jt mobs, including navicular whip, lat and med calcaneal glides, to improve INV/ EV movement Distraction of calcaneus with plantarflexion and inversion stretch  Therex:  Utilized hard cardboard roller, inst pt to roll along L arch to improve stiffness mid foot.  Also reviewed and practiced B heel raises, with inversion component, with theraband around lower legs with isometric abduction B lower legs , cues to maintain hips forward to achieve plantarflexion stretch  08/01/23:   Manual:   Gr 2/3 jt mobs, initially mobilization with movement for L ankle subtalar jt with lunges with L LE on foot stool, 3 sets 15 reps Inferior/superior jt mobs, mid foot, emphasis on lateral foot, combined with inversion motion.   Distraction of L calcaneus combined with plantarflexion stretch Distraction of mid foot and tarsals  Therex: modified home program to emphasize stretching of L ankle into dorsiflexion and avoiding medial foot/ ankle overpronation pressure.  Utilized 2" book, for B dorsiflexion stretch, with theraband around ankles , for isometric push into inversion B while  performing  B foot drop. Performed to fatigue, cues frequently to keep hips forward to isolate stretch B plantarflexors. 07/27/23 THERAPEUTIC EXERCISE: to improve flexibility, strength and mobility.  Demonstration, verbal and tactile cues throughout for technique.  Rec Bike - L2 x 6 min Kneeling L subtalar mobs with towel + DF stretch 3 x 30 sec Long-sitting L ankle CW/CCW circles 2 x 10 each direction Long-sitting L ankle D1/2 PNF diagonals 2 x 10 each direction L SLS 3 x 10-15 sec, 1 pole A for balance L SLS + 5-way star/clock taps to colored dots x 10, 1 pole A for balance Standing heel/toe raises x 20 - increased shift to R LE noted L ankle inversion and eversion isometrics into ball 10 x 5" each, 2 sets  MANUAL THERAPY: To promote improved flexibility, improved joint mobility, increased ROM, reduced pain, and reduced edema. Gentle distraction of subtalar jt L foot Distraction tarsals Grade II-III A/P subtalar joint mobs L tarsal/metatarsal mobs Retrograde massage for edema reduction  MODALITIES: Game [employer] vasopneumatic compression post session to L ankle x 10 min, high compression, 34 to reduce post-exercise pain and swelling/edema   07/25/23 THERAPEUTIC EXERCISE: to improve flexibility, strength and mobility.  Demonstration, verbal and tactile cues throughout for technique.  Rec Bike - L1 x 6 min L ankle black TB PF & DF 2 x 10 each L ankle inversion and eversion isometrics into ball 10 x 5" each L PF stretch forefoot on 1" book to block rotation/ abduction of rear foot 3 x 30" L DF mob with black TB at ankle in L PF stretch with & w/o 1" book 2 x 30" each  MANUAL THERAPY: To promote improved flexibility, improved joint mobility, increased ROM, reduced pain, and reduced edema. Gentle distraction of subtalar jt L foot Distraction tarsals Grade II-III A/P subtalar joint mobs L metatarsal mobs Retrograde massage  for edema reduction  STM to L ant tib, peroneals, and  gastroc/soleus  MODALITIES: Game Ready vasopneumatic compression post session to L foot and ankle x 10 min, medium compression, 34 to reduce post-exercise pain and swelling/edema   07/20/23:  Evaluation, gentle distraction of subtalar jt, distraction tarsals. Instructed in therex as described below to improve L ankle ROM and strength, with black t band Stretch L plantarflexors with forefoot on 1" book to block rotation/ abduction of rear foot    PATIENT EDUCATION:  Education details: HEP review and HEP modification - ankle IV/EV switch to isometrics   Person educated: Patient Education method: Explanation, Demonstration, Tactile cues, Verbal cues, and Handouts Education comprehension: verbalized understanding, returned demonstration, verbal cues required, tactile cues required, and needs further education  HOME EXERCISE PROGRAM: Access Code: [email or web address] URL: [email or web address] Date: 07/25/2023 Prepared by: Glenetta Hew  Exercises - Gastroc Stretch on Step  - 2-3 x daily - 7 x weekly - 1 sets - 3 reps - 30 sec hold - Long Sitting Ankle Plantar Flexion with Resistance  - 1 x daily - 7 x weekly - 3 sets - 10 reps - 3 sec hold - Seated Ankle Dorsiflexion with Resistance  - 1 x daily - 7 x weekly - 2 sets - 10 reps - 3 sec hold - Isometric Ankle Inversion  - 1 x daily - 7 x weekly - 2 sets - 10 reps - 3-5 sec hold - Isometric Ankle Eversion at Wall  - 1 x daily - 7 x weekly - 2 sets - 10 reps - 3-5 sec hold  ASSESSMENT:  CLINICAL IMPRESSION: Alisabeth Davide"  is attending skilled PT to address L foot/ ankle pain. Responded well overall to manual stretching, unfortunately flared ankle back up again with walking after last session.  Possibly has enough limb length difference to be taxing l medial ankle with gait so added 1/8 " lift R shoe for a trial. She is going to continue with skilled PT 1 x week, will continue to address mobility of L medial ankle, subtalar jt.      OBJECTIVE IMPAIRMENTS: decreased activity tolerance, decreased balance, decreased mobility, difficulty walking, decreased ROM, decreased strength, hypomobility, increased edema, increased fascial restrictions, impaired flexibility, and pain.   ACTIVITY LIMITATIONS: carrying, squatting, stairs, and locomotion level  PARTICIPATION LIMITATIONS: cleaning, laundry, shopping, community activity, and yard work  PERSONAL FACTORS: Age, Behavior pattern, Education, Fitness, and Time since onset of injury/illness/exacerbation are also affecting patient's functional outcome.   REHAB POTENTIAL: Good  CLINICAL DECISION MAKING: Stable/uncomplicated  EVALUATION COMPLEXITY: Low   GOALS: Goals reviewed with patient? Yes  SHORT TERM GOALS: Target date: 08/03/23 I HEP  Baseline:initiated at eval Goal status: IN PROGRESS   LONG TERM GOALS: Target date: 09/14/23  Improve L ankle dorsiflexion AROM to 10  degrees or better, and INV, EV to 35 degrees or better for improved walking efficiency Baseline:  Goal status: INITIAL  2.  Strength L ankle plantarflexion 4/5, able to unilaterally perform L heel raise 15x for ability to walk prolonged distances Baseline:  Goal status: INITIAL  3.  FGA score 30/30 Baseline: TBD not tested initial eval due to painful gait Goal status: INITIAL  4.  6 min walk test without antalgia L Baseline: antalgic gait consistently Goal status: INITIAL   PLAN:  PT FREQUENCY: 2x/week  PT DURATION: 8 weeks  PLANNED INTERVENTIONS: Therapeutic exercises, Therapeutic activity, Neuromuscular re-education, Balance training, Gait training, Patient/Family education, Self Care, and Joint mobilization  PLAN FOR  NEXT SESSION: Progress with more self mobilization instruction, manual techniques to tolerance, progress therex as needed.   Cristan Scherzer L Sheralyn Pinegar, PT, DPT, OCS 08/22/2023, 4:44 PM

## 2023-08-25 ENCOUNTER — Other Ambulatory Visit (HOSPITAL_COMMUNITY): Payer: 59

## 2023-08-25 ENCOUNTER — Other Ambulatory Visit (HOSPITAL_COMMUNITY): Payer: 59 | Attending: Oncology

## 2023-08-29 ENCOUNTER — Other Ambulatory Visit: Payer: Self-pay | Admitting: Family Medicine

## 2023-08-29 DIAGNOSIS — Z1231 Encounter for screening mammogram for malignant neoplasm of breast: Secondary | ICD-10-CM

## 2023-08-31 ENCOUNTER — Ambulatory Visit: Payer: 59 | Attending: Orthopedic Surgery

## 2023-08-31 ENCOUNTER — Other Ambulatory Visit: Payer: Self-pay

## 2023-08-31 DIAGNOSIS — S92902A Unspecified fracture of left foot, initial encounter for closed fracture: Secondary | ICD-10-CM | POA: Insufficient documentation

## 2023-08-31 DIAGNOSIS — M25672 Stiffness of left ankle, not elsewhere classified: Secondary | ICD-10-CM | POA: Insufficient documentation

## 2023-08-31 DIAGNOSIS — R262 Difficulty in walking, not elsewhere classified: Secondary | ICD-10-CM | POA: Diagnosis not present

## 2023-08-31 NOTE — Therapy (Signed)
OUTPATIENT PHYSICAL THERAPY TREATMENT   Patient Name: Debra Downs MRN: [id number] DOB:Oct 31, 1975, 47 y.o., female Today's Date: 08/31/2023  END OF SESSION:  PT End of Session - 08/31/23 1636     Visit Number 9    Date for PT Re-Evaluation 09/14/23    Progress Note Due on Visit 10    PT Start Time 1315    PT Stop Time 1410    PT Time Calculation (min) 55 min    Activity Tolerance Patient tolerated treatment well    Behavior During Therapy WFL for tasks assessed/performed                  Past Medical History:  Diagnosis Date   GERD (gastroesophageal reflux disease)    Seborrheic dermatitis    Past Surgical History:  Procedure Laterality Date   LASIK  2007   Patient Active Problem List   Diagnosis Date Noted   Left foot pain 04/24/2013    PCP: Darrow Bussing, MD  REFERRING PROVIDER: Toni Arthurs, MD  REFERRING DIAG: L midfoot stress fracture  THERAPY DIAG:  Difficulty in walking, not elsewhere classified  Fracture, foot, left, closed, initial encounter  Stiffness of left ankle, not elsewhere classified  Rationale for Evaluation and Treatment: Rehabilitation  ONSET DATE: one year, 2023  NEXT MD VISIT: none scheduled with orthopedist   SUBJECTIVE:   SUBJECTIVE STATEMENT: Pt felt good after last session which was 5 days ago.  Walking about 2 miles, every other day.  Still limited with descending steps.  Intermittent edema L ankle.  No new or unusual Sx with utilizing the insert R shoe  EVAL: Pain L ant ankle foot, L tibialis anterior cramps after prolonged walking, also painful walking, weight bearing on uneven surfaces  PERTINENT HISTORY: H/o L foot swelling , pain for a year.  Saw DPM, treated for tendonitis.  Saw orthopedist had MRI, in hard cast for 6 weeks with scooter, then walking boot for 6 weeks.  In compression sock and regular tennis shoes now for 2 months. Orthopedic MD orders state L ankle ROM, strengthening, gait training, dry  needling L tibialis anterior PAIN:  Are you having pain? No  PRECAUTIONS: None  RED FLAGS: None   WEIGHT BEARING RESTRICTIONS: No  FALLS:  Has patient fallen in last 6 months? 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  PALPATION: Tender over navicular and medial cuneiform  LOWER EXTREMITY ROM:            ROM R   A     L    A/P L  A/P  Ankle dorsiflexion 14 -10/0 8/14  Ankle plantarflexion 78 60/60   Ankle inversion 70 5/5 9/12  Ankle eversion 35 5/5 7/14  L gr toe extension 30 degrees  LOWER EXTREMITY MMT:  ALL WFL unless otherwise noted:  MMT Right eval Left eval  Hip flexion    Hip extension    Hip abduction    Hip adduction    Hip internal rotation    Hip external rotation    Knee flexion    Knee extension    Ankle dorsiflexion  4  Ankle plantarflexion  3-  Ankle inversion  2+  Ankle eversion  2+     LOWER EXTREMITY SPECIAL TESTS:  Ankle special tests: Talar tilt test: positive   FUNCTIONAL TESTS:  6 minute walk test: TBD Functional gait assessment: TBD  GAIT: Distance walked: 29' in clinic Assistive device utilized: None Level of assistance: Modified independence Comments: decreased stance time on L, flat foot contact L, hyperextends L knee, avoids roll off L distal toes, decreased stride  length R by 1/2 distance   TODAY'S TREATMENT:                                                                                                                              DATE:  08/31/23:  Measured L ankle dorsiflexion in supine to 18 degrees,basically equal to R ankle  Standing knee bend with marked difference L compared to R, R knee able to advance 3 to 4 " past toes, L knee over toes.  performed mob with movement with L foot on step, subtalar post  glides.  Relief noted by pt with improved movement after this technique, performed 3 bouts 10 reps each Also pain with L foot inversion and plantarflexion, relief with distraction and superior glide of navicular bone   Instructed in gradual progression of forward heel taps with L LE weight bearing and R heel taps, performed from 2" then from 4" step today.   08/22/23: Manual:  assessed L ankle movement, dorsiflexion 11 degrees, plantarflexion quite restricted today , 2/3 of ROM as compared to R foot.  Utilized gentle distraction of L mid foot without much relief or movement, then utilized AP glides subtalar jt, in supine while assisting with dorsiflexion stretch , which provided some relief, so performed mob with movement with L foot on step, subtalar post  glides.  Relief noted by pt with improved movement after this technique, performed 3 bouts 10 reps each Also pain with L foot inversion and plantarflexion, relief with distraction and superior glide of navicular bone .   Demonstrated to pt self mob with theraband across subtalar jt with L foot on chair with lunging , also demonstrated navicular distraction and superior glide in tailor sitting position.  Also reassessed hip ROM, which was full and symmetrical B L iliac crest elevated,, added 1/8" cork R shoe to adapt for limb length asymmetry and provide for a more symmetrical gait pattern.  08/17/23:  Manual:  all manual techniques today to improve mid foot and subtalar jt mobility as  follows: Long sitting for A/P navicular mobs, stabilizing forefoot with cardboard roll under foot,5 bouts, 30 to 45 sec holds/oscillations Side lying on L  for subtalar jt lateral glides, 5 bouts, 30 to 45 sec holds oscillations. Lunges forward, mobilization with movement L subtalar jt, 2 bouts of 15 reps.    Gait following interventions, patient still tends to shorten her stride length R, reports L lateral foot pain today when attempting to elongate R step length., therefore utilized kinesiotaping to support plantar fascia L , with 2- I pieces, with attachments tarsals mid and lateral, extending to med and lat gastroc proximally.  08/10/23 Manual:  long sitting for traction subtalar jt, combined with overpressure and medial glides navicular, to address inversion movement .  prone HVLA whip navicular, also calcaneal and subtalar jt distraction  Therex:  Initially walked 700' to improve tissue perfusion, jt mobility Seated then standing with L foot on 1/2 foam roller, combined with PF/ DF, emphasis on isolating inversion motion L ankle  Instructed in new ex for home, with folded yoga mat under medial forefoot, heel on floor, with unilateral heel raises, to fatigue, approximately 30 reps.  To perform 2 - 3 x day to fatigue but advised to perform without pain. 08/03/23: Manual:  mobilization with movement , combined with lunges, L subtalar jt post/lat glides 3 bouts 15 reps to improve dorsiflexion motion and mechanics Various rearfoot jt mobs, including navicular whip, lat and med calcaneal glides, to improve INV/ EV movement Distraction of calcaneus with plantarflexion and inversion stretch  Therex:  Utilized hard cardboard roller, inst pt to roll along L arch to improve stiffness mid foot.  Also reviewed and practiced B heel raises, with inversion component, with theraband around lower legs with isometric abduction B lower legs , cues to maintain hips forward to achieve plantarflexion  stretch  08/01/23:   Manual:   Gr 2/3 jt mobs, initially mobilization with movement for L ankle subtalar jt with lunges with L LE on foot stool, 3 sets 15 reps Inferior/superior jt mobs, mid foot, emphasis on lateral foot, combined with inversion motion.   Distraction of L calcaneus combined with plantarflexion stretch Distraction of mid foot and tarsals  Therex: modified home program to emphasize stretching of L ankle into dorsiflexion and avoiding medial foot/ ankle overpronation pressure.  Utilized 2" book, for B dorsiflexion stretch, with theraband around ankles , for isometric push into inversion B while performing  B foot drop. Performed to fatigue, cues frequently to keep hips forward to isolate stretch B plantarflexors. 07/27/23 THERAPEUTIC EXERCISE: to improve flexibility, strength and mobility.  Demonstration, verbal and tactile cues throughout for technique.  Rec Bike - L2 x 6 min Kneeling L subtalar mobs with towel + DF stretch 3 x 30 sec Long-sitting L ankle CW/CCW circles 2 x 10 each direction Long-sitting L ankle D1/2 PNF diagonals 2 x 10 each direction L SLS 3 x 10-15 sec, 1 pole A for balance L SLS + 5-way star/clock taps to colored dots x 10, 1 pole A for balance Standing heel/toe raises x 20 - increased shift to R LE noted L ankle inversion and eversion isometrics into ball 10 x 5" each, 2 sets  MANUAL THERAPY: To promote improved flexibility, improved joint mobility, increased ROM, reduced pain, and reduced edema. Gentle distraction of subtalar jt L foot Distraction tarsals Grade II-III A/P subtalar joint mobs L tarsal/metatarsal mobs Retrograde massage for edema reduction  MODALITIES: Game Ready vasopneumatic compression post session to L ankle x 10 min, high compression, 34 to reduce post-exercise pain and swelling/edema   07/25/23 THERAPEUTIC EXERCISE: to improve flexibility, strength and mobility.  Demonstration, verbal  and tactile cues throughout for technique.   Rec Bike - L1 x 6 min L ankle black TB PF & DF 2 x 10 each L ankle inversion and eversion isometrics into ball 10 x 5" each L PF stretch forefoot on 1" book to block rotation/ abduction of rear foot 3 x 30" L DF mob with black TB at ankle in L PF stretch with & w/o 1" book 2 x 30" each  MANUAL THERAPY: To promote improved flexibility, improved joint mobility, increased ROM, reduced pain, and reduced edema. Gentle distraction of subtalar jt L foot Distraction tarsals Grade II-III A/P subtalar joint mobs L metatarsal mobs Retrograde massage for edema reduction  STM to L ant tib, peroneals, and gastroc/soleus  MODALITIES: Game Ready vasopneumatic compression post session to L foot and ankle x 10 min, medium compression, 34 to reduce post-exercise pain and swelling/edema   07/20/23:  Evaluation, gentle distraction of subtalar jt, distraction tarsals. Instructed in therex as described below to improve L ankle ROM and strength, with black t band Stretch L plantarflexors with forefoot on 1" book to block rotation/ abduction of rear foot    PATIENT EDUCATION:  Education details: HEP review and HEP modification - ankle IV/EV switch to isometrics   Person educated: Patient Education method: Explanation, Demonstration, Tactile cues, Verbal cues, and Handouts Education comprehension: verbalized understanding, returned demonstration, verbal cues required, tactile cues required, and needs further education  HOME EXERCISE PROGRAM: Access Code: [email or web address] URL: [email or web address] Date: 07/25/2023 Prepared by: Glenetta Hew  Exercises - Gastroc Stretch on Step  - 2-3 x daily - 7 x weekly - 1 sets - 3 reps - 30 sec hold - Long Sitting Ankle Plantar Flexion with Resistance  - 1 x daily - 7 x weekly - 3 sets - 10 reps - 3 sec hold - Seated Ankle Dorsiflexion with Resistance  - 1 x daily - 7 x weekly - 2 sets - 10 reps - 3 sec hold - Isometric Ankle Inversion  - 1 x daily - 7 x  weekly - 2 sets - 10 reps - 3-5 sec hold - Isometric Ankle Eversion at Wall  - 1 x daily - 7 x weekly - 2 sets - 10 reps - 3-5 sec hold  ASSESSMENT:  CLINICAL IMPRESSION: Debra Downs"  is attending skilled PT to address L foot/ ankle pain. Walking longer distances more often. Still has some gait deviations with hyperextension of L knee and difficulty advancing effectively over to her toes for toe off movement.  Tolerating lift R shoe well, changed to rubber lift today. She is going to continue with skilled PT 1 x week, will continue to address mobility of L medial ankle, subtalar jt.     OBJECTIVE IMPAIRMENTS: decreased activity tolerance, decreased balance, decreased mobility, difficulty walking, decreased ROM, decreased strength, hypomobility, increased edema, increased fascial restrictions, impaired flexibility, and pain.   ACTIVITY LIMITATIONS: carrying, squatting, stairs, and locomotion level  PARTICIPATION LIMITATIONS: cleaning, laundry, shopping, community activity, and yard work  PERSONAL FACTORS: Age, Behavior pattern, Education, Fitness, and Time since onset of injury/illness/exacerbation are also affecting patient's functional outcome.   REHAB POTENTIAL: Good  CLINICAL DECISION MAKING: Stable/uncomplicated  EVALUATION COMPLEXITY: Low   GOALS: Goals reviewed with patient? Yes  SHORT TERM GOALS: Target date: 08/03/23 I HEP  Baseline:initiated at eval Goal status: IN PROGRESS   LONG TERM GOALS: Target date: 09/14/23  Improve L ankle dorsiflexion AROM to 10  degrees or better, and INV, EV to 35 degrees or  better for improved walking efficiency Baseline:  Goal status: INITIAL  2.  Strength L ankle plantarflexion 4/5, able to unilaterally perform L heel raise 15x for ability to walk prolonged distances Baseline:  Goal status: INITIAL  3.  FGA score 30/30 Baseline: TBD not tested initial eval due to painful gait Goal status: INITIAL  4.  6 min walk test without  antalgia L Baseline: antalgic gait consistently Goal status: INITIAL   PLAN:  PT FREQUENCY: 2x/week  PT DURATION: 8 weeks  PLANNED INTERVENTIONS: Therapeutic exercises, Therapeutic activity, Neuromuscular re-education, Balance training, Gait training, Patient/Family education, Self Care, and Joint mobilization  PLAN FOR NEXT SESSION: Progress with more self mobilization instruction, patient to go out of town after next week so will hold or DC PT at that time.  Have discussed potentially needing different orthotic for L foot.  Cheril Slattery Frazier Richards, PT, DPT, OCS 08/31/2023, 4:38 PM

## 2023-09-07 ENCOUNTER — Other Ambulatory Visit: Payer: Self-pay

## 2023-09-07 ENCOUNTER — Ambulatory Visit: Payer: 59

## 2023-09-07 DIAGNOSIS — M25672 Stiffness of left ankle, not elsewhere classified: Secondary | ICD-10-CM

## 2023-09-07 DIAGNOSIS — R262 Difficulty in walking, not elsewhere classified: Secondary | ICD-10-CM | POA: Diagnosis not present

## 2023-09-07 DIAGNOSIS — S92902A Unspecified fracture of left foot, initial encounter for closed fracture: Secondary | ICD-10-CM

## 2023-09-07 NOTE — Therapy (Signed)
OUTPATIENT PHYSICAL THERAPY TREATMENT   Patient Name: Debra Downs MRN: [id number] DOB:05-19-1976, 47 y.o., female Today's Date: 09/07/2023  END OF SESSION:  PT End of Session - 09/07/23 1622     Visit Number 10    Date for PT Re-Evaluation 09/14/23    Authorization Type [facility] [employer]    Progress Note Due on Visit 10    PT Start Time 0845    PT Stop Time 0930    PT Time Calculation (min) 45 min    Activity Tolerance Patient tolerated treatment well    Behavior During Therapy WFL for tasks assessed/performed              Past Medical History:  Diagnosis Date   GERD (gastroesophageal reflux disease)    Seborrheic dermatitis    Past Surgical History:  Procedure Laterality Date   LASIK  2007   Patient Active Problem List   Diagnosis Date Noted   Left foot pain 04/24/2013    PCP: Darrow Bussing, MD  REFERRING PROVIDER: Toni Arthurs, MD  REFERRING DIAG: L midfoot stress fracture  THERAPY DIAG:  Difficulty in walking, not elsewhere classified  Fracture, foot, left, closed, initial encounter  Stiffness of left ankle, not elsewhere classified  Rationale for Evaluation and Treatment: Rehabilitation  ONSET DATE: one year, 2023  NEXT MD VISIT: none scheduled with orthopedist   SUBJECTIVE:   SUBJECTIVE STATEMENT: Pt overall gradual improvement, walking up to 2 miles but every other. Utilized 4" step for forward R heel taps then progressed to 5".  Still some swelling, unable to roll off toes with gait because hurts L lateral foot.  Out of town for 2 week trip as of this weekend. EVAL: Pain L ant ankle foot, L tibialis anterior cramps after prolonged walking, also painful walking, weight bearing on uneven surfaces  PERTINENT HISTORY: H/o L foot swelling , pain for a year.  Saw DPM, treated for tendonitis.  Saw orthopedist had MRI, in hard cast for 6 weeks with scooter, then walking boot for 6 weeks.  In compression sock and regular tennis shoes now for 2  months. Orthopedic MD orders state L ankle ROM, strengthening, gait training, dry needling L tibialis anterior PAIN:  Are you having pain? No  PRECAUTIONS: None  RED FLAGS: None   WEIGHT BEARING RESTRICTIONS: No  FALLS:  Has patient fallen in last 6 months? 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  PALPATION: Tender over navicular and medial cuneiform  LOWER EXTREMITY ROM:            ROM R   A     L    A/P L  A/P L A/P  Ankle dorsiflexion 14 -10/0 8/14 18/18  Ankle plantarflexion 78 60/60  70/70  Ankle inversion 70 5/5 9/12 10/15  Ankle eversion 35 5/5 7/14 nt  L gr toe extension 30 degrees  LOWER EXTREMITY MMT:  ALL WFL unless otherwise noted:   MMT Right eval Left eval  Hip flexion    Hip extension    Hip abduction    Hip adduction    Hip internal rotation    Hip external rotation    Knee flexion    Knee extension    Ankle dorsiflexion  4  Ankle plantarflexion  3-  Ankle inversion  2+  Ankle eversion  2+     LOWER EXTREMITY SPECIAL TESTS:  Ankle special tests: Talar tilt test: positive   FUNCTIONAL TESTS:  6 minute walk test: TBD Functional gait assessment: TBD  GAIT: Distance walked: 34' in clinic Assistive device utilized: None Level of assistance: Modified independence Comments: decreased  stance time on L, flat foot contact L, hyperextends L knee, avoids roll off L distal toes, decreased stride length R by 1/2 distance   TODAY'S TREATMENT:                                                                                                                              DATE:  09/07/23:  Reassessed ankle motion,  Manual techniques: supine for navicular A/P mobs,  Prone for calcaneal and subtalar distraction Side lying L for calcaneal lateral glides Standing for mob with movement  L foot on step stool, subtalar glides L with ankle dorsiflexion  Gait in hallway, over 350'  tends to hyperextend L knee and avoids rolling off L forefoot, toes  08/31/23:  Measured L ankle dorsiflexion in supine to 18 degrees,basically equal to R ankle  Standing knee bend with marked difference L compared to R, R knee able to advance 3 to 4 " past toes, L knee over toes.  performed mob with movement with L foot on step, subtalar post  glides.  Relief noted by pt with improved movement after this technique, performed 3 bouts 10 reps each Also pain with L foot inversion and plantarflexion, relief with distraction and superior glide of navicular bone   Instructed in gradual progression of forward heel taps with L LE weight bearing and R heel taps, performed from 2" then from 4" step today.   08/22/23: Manual:  assessed L ankle movement, dorsiflexion 11 degrees, plantarflexion quite restricted today , 2/3 of ROM as compared to R foot.  Utilized gentle distraction of L mid foot without much relief or movement, then utilized AP glides subtalar jt, in supine while assisting with dorsiflexion stretch , which provided some relief, so performed mob with movement with L foot on step, subtalar post  glides.  Relief noted by pt with improved movement after this technique, performed 3 bouts 10 reps each Also pain with L foot inversion and plantarflexion, relief with distraction and superior glide of navicular bone .    Demonstrated to pt self mob with theraband across subtalar jt with L foot on chair with lunging , also demonstrated navicular distraction and  superior glide in tailor sitting position.  Also reassessed hip ROM, which was full and symmetrical B L iliac crest elevated,, added 1/8" cork R shoe to adapt for limb length asymmetry and provide for a more symmetrical gait pattern.  08/17/23:  Manual:  all manual techniques today to improve mid foot and subtalar jt mobility as follows: Long sitting for A/P navicular mobs, stabilizing forefoot with cardboard roll under foot,5 bouts, 30 to 45 sec holds/oscillations Side lying on L for subtalar jt lateral glides, 5 bouts, 30 to 45 sec holds oscillations. Lunges forward, mobilization with movement L subtalar jt, 2 bouts of 15 reps.    Gait following interventions, patient still tends to shorten her stride length R, reports L lateral foot pain today when attempting to elongate R step length., therefore utilized kinesiotaping to support plantar fascia L , with 2- I pieces, with attachments tarsals mid and lateral, extending to med and lat gastroc proximally.  08/10/23 Manual:  long sitting for traction subtalar jt, combined with overpressure and medial glides navicular, to address inversion movement .  prone HVLA whip navicular, also calcaneal and subtalar jt distraction  Therex:  Initially walked 700' to improve tissue perfusion, jt mobility Seated then standing with L foot on 1/2 foam roller, combined with PF/ DF, emphasis on isolating inversion motion L ankle  Instructed in new ex for home, with folded yoga mat under medial forefoot, heel on floor, with unilateral heel raises, to fatigue, approximately 30 reps.  To perform 2 - 3 x day to fatigue but advised to perform without pain. 08/03/23: Manual:  mobilization with movement , combined with lunges, L subtalar jt post/lat glides 3 bouts 15 reps to improve dorsiflexion motion and mechanics Various  rearfoot jt mobs, including navicular whip, lat and med calcaneal glides, to improve INV/ EV movement Distraction of calcaneus with plantarflexion and inversion stretch  Therex:  Utilized hard cardboard roller, inst pt to roll along L arch to improve stiffness mid foot.  Also reviewed and practiced B heel raises, with inversion component, with theraband around lower legs with isometric abduction B lower legs , cues to maintain hips forward to achieve plantarflexion stretch  08/01/23:   Manual:   Gr 2/3 jt mobs, initially mobilization with movement for L ankle subtalar jt with lunges with L LE on foot stool, 3 sets 15 reps Inferior/superior jt mobs, mid foot, emphasis on lateral foot, combined with inversion motion.   Distraction of L calcaneus combined with plantarflexion stretch Distraction of mid foot and tarsals  Therex: modified home program to emphasize stretching of L ankle into dorsiflexion and avoiding medial foot/ ankle overpronation pressure.  Utilized 2" book, for B dorsiflexion stretch, with theraband around ankles , for isometric push into inversion B while performing  B foot drop. Performed to fatigue, cues frequently to keep hips forward to isolate stretch B plantarflexors. 07/27/23 THERAPEUTIC EXERCISE: to improve flexibility, strength and mobility.  Demonstration, verbal and tactile cues throughout for technique.  Rec Bike - L2 x 6 min Kneeling L subtalar mobs with towel + DF stretch 3 x 30 sec Long-sitting L ankle CW/CCW circles 2 x 10 each direction Long-sitting L ankle D1/2 PNF diagonals 2 x 10 each direction L SLS 3 x 10-15 sec, 1 pole A for balance L SLS + 5-way star/clock taps to colored dots x 10, 1 pole A for balance Standing heel/toe raises x 20 - increased shift to R LE noted L ankle inversion and eversion isometrics into ball 10  x 5" each, 2 sets  MANUAL THERAPY: To promote improved flexibility, improved joint mobility, increased ROM, reduced pain, and reduced  edema. Gentle distraction of subtalar jt L foot Distraction tarsals Grade II-III A/P subtalar joint mobs L tarsal/metatarsal mobs Retrograde massage for edema reduction  MODALITIES: Game Ready vasopneumatic compression post session to L ankle x 10 min, high compression, 34 to reduce post-exercise pain and swelling/edema   07/25/23 THERAPEUTIC EXERCISE: to improve flexibility, strength and mobility.  Demonstration, verbal and tactile cues throughout for technique.  Rec Bike - L1 x 6 min L ankle black TB PF & DF 2 x 10 each L ankle inversion and eversion isometrics into ball 10 x 5" each L PF stretch forefoot on 1" book to block rotation/ abduction of rear foot 3 x 30" L DF mob with black TB at ankle in L PF stretch with & w/o 1" book 2 x 30" each  MANUAL THERAPY: To promote improved flexibility, improved joint mobility, increased ROM, reduced pain, and reduced edema. Gentle distraction of subtalar jt L foot Distraction tarsals Grade II-III A/P subtalar joint mobs L metatarsal mobs Retrograde massage for edema reduction  STM to L ant tib, peroneals, and gastroc/soleus  MODALITIES: Game Ready vasopneumatic compression post session to L foot and ankle x 10 min, medium compression, 34 to reduce post-exercise pain and swelling/edema   07/20/23:  Evaluation, gentle distraction of subtalar jt, distraction tarsals. Instructed in therex as described below to improve L ankle ROM and strength, with black t band Stretch L plantarflexors with forefoot on 1" book to block rotation/ abduction of rear foot    PATIENT EDUCATION:  Education details: HEP review and HEP modification - ankle IV/EV switch to isometrics   Person educated: Patient Education method: Explanation, Demonstration, Tactile cues, Verbal cues, and Handouts Education comprehension: verbalized understanding, returned demonstration, verbal cues required, tactile cues required, and needs further education  HOME EXERCISE  PROGRAM: Access Code: [email or web address] URL: [email or web address] Date: 07/25/2023 Prepared by: Glenetta Hew  Exercises - Gastroc Stretch on Step  - 2-3 x daily - 7 x weekly - 1 sets - 3 reps - 30 sec hold - Long Sitting Ankle Plantar Flexion with Resistance  - 1 x daily - 7 x weekly - 3 sets - 10 reps - 3 sec hold - Seated Ankle Dorsiflexion with Resistance  - 1 x daily - 7 x weekly - 2 sets - 10 reps - 3 sec hold - Isometric Ankle Inversion  - 1 x daily - 7 x weekly - 2 sets - 10 reps - 3-5 sec hold - Isometric Ankle Eversion at Wall  - 1 x daily - 7 x weekly - 2 sets - 10 reps - 3-5 sec hold  ASSESSMENT:  CLINICAL IMPRESSION: Debra Downs"  has  attended skilled PT to address L foot/ ankle pain. She is walking longer distances more often. Still has some gait deviations with hyperextension of L knee and difficulty advancing effectively over to her toes for toe off movement.  Tolerating lift R shoe well. Still has edema L medial ankle/ subtalar region with bony end feel with inversion.  She Is going out of town for 2 weeks. We discussed that if her current slow progress plateaus or she worsens she may need to follow up with the referring orthopedist.  Also that she may benefit from specialized orthotics.  She may still be experiencing marrow edema or some bony changes in her alignment with the chronicity of this problem.  DC form skilled PT at this time.  OBJECTIVE IMPAIRMENTS: decreased activity tolerance, decreased balance, decreased mobility, difficulty walking, decreased ROM, decreased strength, hypomobility, increased edema, increased fascial restrictions, impaired flexibility, and pain.   ACTIVITY LIMITATIONS: carrying, squatting, stairs, and locomotion level  PARTICIPATION LIMITATIONS: cleaning, laundry, shopping, community activity, and yard work  PERSONAL FACTORS: Age, Behavior pattern, Education, Fitness, and Time since onset of injury/illness/exacerbation are also  affecting patient's functional outcome.   REHAB POTENTIAL: Good  CLINICAL DECISION MAKING: Stable/uncomplicated  EVALUATION COMPLEXITY: Low   GOALS: Goals reviewed with patient? Yes  SHORT TERM GOALS: Target date: 08/03/23 I HEP  Baseline:initiated at eval Goal status: IN PROGRESS   LONG TERM GOALS: Target date: 09/14/23  Improve L ankle dorsiflexion AROM to 10  degrees or better, and INV, EV to 35 degrees or better for improved walking efficiency Baseline:  Goal status: 09/07/23: partially met, still limited INV with hard /bony end feel  2.  Strength L ankle plantarflexion 4/5, able to unilaterally perform L heel raise 15x for ability to walk prolonged distances Baseline: able to perform on previous visits  Goal status: MET  3.  FGA score 30/30 Baseline: TBD not tested initial eval due to painful gait Goal status: met 09/07/23  4.  6 min walk test without antalgia L Baseline: antalgic gait consistently Goal status: met 09/07/23  still locks L knee into extension and avoids toe off L   PLAN:  PT FREQUENCY: 2x/week  PT DURATION: 8 weeks  PLANNED INTERVENTIONS: Therapeutic exercises, Therapeutic activity, Neuromuscular re-education, Balance training, Gait training, Patient/Family education, Self Care, and Joint mobilization  PLAN FOR NEXT SESSION: Progress with more self mobilization instruction, patient to go out of town this week so will DC pt at this time from formal PT.   Kabir Brannock Frazier Richards, PT, DPT, OCS 09/07/2023, 4:38 PM

## 2023-09-27 ENCOUNTER — Ambulatory Visit
Admission: RE | Admit: 2023-09-27 | Discharge: 2023-09-27 | Disposition: A | Discharge status: Home / Self Care | Payer: 59 | Source: Ambulatory Visit

## 2023-09-27 DIAGNOSIS — Z1231 Encounter for screening mammogram for malignant neoplasm of breast: Secondary | ICD-10-CM | POA: Diagnosis not present

## 2024-01-15 DIAGNOSIS — M19072 Primary osteoarthritis, left ankle and foot: Secondary | ICD-10-CM | POA: Diagnosis not present

## 2024-01-25 DIAGNOSIS — M19072 Primary osteoarthritis, left ankle and foot: Secondary | ICD-10-CM | POA: Diagnosis not present

## 2024-01-29 DIAGNOSIS — Z0000 Encounter for general adult medical examination without abnormal findings: Secondary | ICD-10-CM | POA: Diagnosis not present

## 2024-03-20 DIAGNOSIS — M19072 Primary osteoarthritis, left ankle and foot: Secondary | ICD-10-CM | POA: Diagnosis not present

## 2024-04-09 DIAGNOSIS — M25572 Pain in left ankle and joints of left foot: Secondary | ICD-10-CM | POA: Diagnosis not present

## 2024-04-09 DIAGNOSIS — G8929 Other chronic pain: Secondary | ICD-10-CM | POA: Diagnosis not present

## 2024-04-17 DIAGNOSIS — M19072 Primary osteoarthritis, left ankle and foot: Secondary | ICD-10-CM | POA: Diagnosis not present

## 2024-05-03 ENCOUNTER — Other Ambulatory Visit: Payer: Self-pay | Admitting: Orthopaedic Surgery

## 2024-05-03 ENCOUNTER — Encounter: Payer: Self-pay | Admitting: Orthopaedic Surgery

## 2024-05-03 DIAGNOSIS — M25572 Pain in left ankle and joints of left foot: Secondary | ICD-10-CM | POA: Diagnosis not present

## 2024-05-03 DIAGNOSIS — M19072 Primary osteoarthritis, left ankle and foot: Secondary | ICD-10-CM | POA: Diagnosis not present

## 2024-05-06 ENCOUNTER — Ambulatory Visit
Admission: RE | Admit: 2024-05-06 | Discharge: 2024-05-06 | Disposition: A | Discharge status: Home / Self Care | Source: Ambulatory Visit | Attending: Orthopaedic Surgery | Admitting: Orthopaedic Surgery

## 2024-05-06 DIAGNOSIS — M25462 Effusion, left knee: Secondary | ICD-10-CM | POA: Diagnosis not present

## 2024-05-06 DIAGNOSIS — M24672 Ankylosis, left ankle: Secondary | ICD-10-CM | POA: Diagnosis not present

## 2024-05-06 DIAGNOSIS — M25572 Pain in left ankle and joints of left foot: Secondary | ICD-10-CM

## 2024-05-20 DIAGNOSIS — M79672 Pain in left foot: Secondary | ICD-10-CM | POA: Diagnosis not present

## 2024-05-20 DIAGNOSIS — M25572 Pain in left ankle and joints of left foot: Secondary | ICD-10-CM | POA: Diagnosis not present

## 2024-07-02 ENCOUNTER — Other Ambulatory Visit (HOSPITAL_COMMUNITY): Payer: Self-pay

## 2024-07-02 DIAGNOSIS — Z79899 Other long term (current) drug therapy: Secondary | ICD-10-CM | POA: Diagnosis not present

## 2024-07-02 DIAGNOSIS — L4050 Arthropathic psoriasis, unspecified: Secondary | ICD-10-CM | POA: Diagnosis not present

## 2024-07-02 DIAGNOSIS — M2550 Pain in unspecified joint: Secondary | ICD-10-CM | POA: Diagnosis not present

## 2024-07-02 DIAGNOSIS — M25572 Pain in left ankle and joints of left foot: Secondary | ICD-10-CM | POA: Diagnosis not present

## 2024-07-02 DIAGNOSIS — M549 Dorsalgia, unspecified: Secondary | ICD-10-CM | POA: Diagnosis not present

## 2024-07-02 DIAGNOSIS — L409 Psoriasis, unspecified: Secondary | ICD-10-CM | POA: Diagnosis not present

## 2024-07-02 MED ORDER — PREDNISONE 5 MG PO TABS
ORAL_TABLET | ORAL | 0 refills | Status: DC
  Filled 2024-07-02: qty 63, 18d supply, fill #0

## 2024-07-05 ENCOUNTER — Other Ambulatory Visit (HOSPITAL_COMMUNITY): Payer: Self-pay

## 2024-07-05 MED ORDER — FLUZONE 0.5 ML IM SUSY
0.5000 mL | PREFILLED_SYRINGE | Freq: Once | INTRAMUSCULAR | 0 refills | Status: AC
  Filled 2024-07-05: qty 0.5, 1d supply, fill #0

## 2024-07-05 MED ORDER — METHOTREXATE SODIUM 2.5 MG PO TABS
10.0000 mg | ORAL_TABLET | ORAL | 2 refills | Status: AC
  Filled 2024-07-05: qty 16, 28d supply, fill #0

## 2024-07-05 MED ORDER — FOLIC ACID 1 MG PO TABS
1.0000 mg | ORAL_TABLET | Freq: Every day | ORAL | 2 refills | Status: AC
  Filled 2024-07-05: qty 30, 30d supply, fill #0

## 2024-08-06 ENCOUNTER — Other Ambulatory Visit (HOSPITAL_COMMUNITY): Payer: Self-pay

## 2024-08-06 DIAGNOSIS — L409 Psoriasis, unspecified: Secondary | ICD-10-CM | POA: Diagnosis not present

## 2024-08-06 DIAGNOSIS — H04129 Dry eye syndrome of unspecified lacrimal gland: Secondary | ICD-10-CM | POA: Diagnosis not present

## 2024-08-06 DIAGNOSIS — Z79899 Other long term (current) drug therapy: Secondary | ICD-10-CM | POA: Diagnosis not present

## 2024-08-06 DIAGNOSIS — M25572 Pain in left ankle and joints of left foot: Secondary | ICD-10-CM | POA: Diagnosis not present

## 2024-08-06 DIAGNOSIS — L4050 Arthropathic psoriasis, unspecified: Secondary | ICD-10-CM | POA: Diagnosis not present

## 2024-08-06 DIAGNOSIS — M2550 Pain in unspecified joint: Secondary | ICD-10-CM | POA: Diagnosis not present

## 2024-08-06 MED ORDER — PREDNISONE 5 MG PO TABS
ORAL_TABLET | ORAL | 0 refills | Status: AC
  Filled 2024-08-06: qty 70, 28d supply, fill #0

## 2024-08-07 ENCOUNTER — Other Ambulatory Visit (HOSPITAL_COMMUNITY): Payer: Self-pay

## 2024-08-07 MED ORDER — METHOTREXATE SODIUM 2.5 MG PO TABS
15.0000 mg | ORAL_TABLET | ORAL | 0 refills | Status: AC
  Filled 2024-08-07: qty 12, 14d supply, fill #0

## 2024-08-07 MED ORDER — METHOTREXATE SODIUM 2.5 MG PO TABS: 20.0000 mg | ORAL_TABLET | ORAL | 2 refills | Status: AC

## 2024-08-07 MED ORDER — METHOTREXATE SODIUM 2.5 MG PO TABS
20.0000 mg | ORAL_TABLET | ORAL | 1 refills | Status: DC
  Filled 2024-08-07 – 2024-08-19 (×3): qty 32, 28d supply, fill #0
  Filled 2024-09-09: qty 32, 28d supply, fill #1

## 2024-08-13 DIAGNOSIS — H52223 Regular astigmatism, bilateral: Secondary | ICD-10-CM | POA: Diagnosis not present

## 2024-08-15 ENCOUNTER — Other Ambulatory Visit (HOSPITAL_COMMUNITY): Payer: Self-pay

## 2024-08-16 ENCOUNTER — Other Ambulatory Visit (HOSPITAL_COMMUNITY): Payer: Self-pay

## 2024-08-19 ENCOUNTER — Other Ambulatory Visit (HOSPITAL_COMMUNITY): Payer: Self-pay

## 2024-08-19 ENCOUNTER — Other Ambulatory Visit: Payer: Self-pay

## 2024-09-09 ENCOUNTER — Other Ambulatory Visit: Payer: Self-pay | Admitting: Family Medicine

## 2024-09-09 DIAGNOSIS — Z1231 Encounter for screening mammogram for malignant neoplasm of breast: Secondary | ICD-10-CM

## 2024-09-10 ENCOUNTER — Other Ambulatory Visit (HOSPITAL_COMMUNITY): Payer: Self-pay

## 2024-09-12 ENCOUNTER — Other Ambulatory Visit (HOSPITAL_COMMUNITY): Payer: Self-pay

## 2024-10-09 ENCOUNTER — Inpatient Hospital Stay: Admission: RE | Admit: 2024-10-09 | Discharge: 2024-10-09

## 2024-10-09 DIAGNOSIS — Z1231 Encounter for screening mammogram for malignant neoplasm of breast: Secondary | ICD-10-CM | POA: Diagnosis not present

## 2024-10-09 DIAGNOSIS — Z79899 Other long term (current) drug therapy: Secondary | ICD-10-CM | POA: Diagnosis not present

## 2024-10-09 DIAGNOSIS — M25572 Pain in left ankle and joints of left foot: Secondary | ICD-10-CM | POA: Diagnosis not present

## 2024-10-09 DIAGNOSIS — M2550 Pain in unspecified joint: Secondary | ICD-10-CM | POA: Diagnosis not present

## 2024-10-09 DIAGNOSIS — L409 Psoriasis, unspecified: Secondary | ICD-10-CM | POA: Diagnosis not present

## 2024-10-09 DIAGNOSIS — L4050 Arthropathic psoriasis, unspecified: Secondary | ICD-10-CM | POA: Diagnosis not present

## 2024-10-10 DIAGNOSIS — H16223 Keratoconjunctivitis sicca, not specified as Sjogren's, bilateral: Secondary | ICD-10-CM | POA: Diagnosis not present

## 2024-10-13 ENCOUNTER — Other Ambulatory Visit (HOSPITAL_COMMUNITY): Payer: Self-pay

## 2024-10-14 ENCOUNTER — Other Ambulatory Visit (HOSPITAL_COMMUNITY): Payer: Self-pay

## 2024-10-14 MED ORDER — METHOTREXATE SODIUM 2.5 MG PO TABS
20.0000 mg | ORAL_TABLET | ORAL | 2 refills | Status: AC
  Filled 2024-10-14: qty 32, 28d supply, fill #0

## 2024-10-15 ENCOUNTER — Other Ambulatory Visit (HOSPITAL_COMMUNITY): Payer: Self-pay

## 2024-10-15 MED ORDER — METHOTREXATE SODIUM 2.5 MG PO TABS
20.0000 mg | ORAL_TABLET | ORAL | 0 refills | Status: AC
  Filled 2024-11-10 – 2024-11-14 (×2): qty 96, 84d supply, fill #0

## 2024-11-10 ENCOUNTER — Other Ambulatory Visit (HOSPITAL_COMMUNITY): Payer: Self-pay

## 2024-11-11 ENCOUNTER — Other Ambulatory Visit (HOSPITAL_COMMUNITY): Payer: Self-pay

## 2024-11-11 ENCOUNTER — Encounter (HOSPITAL_COMMUNITY): Payer: Self-pay | Admitting: Pharmacist

## 2024-11-11 ENCOUNTER — Other Ambulatory Visit: Payer: Self-pay

## 2024-11-14 ENCOUNTER — Other Ambulatory Visit: Payer: Self-pay

## 2024-11-15 ENCOUNTER — Other Ambulatory Visit (HOSPITAL_COMMUNITY): Payer: Self-pay
